# Patient Record
Sex: Female | Born: 1980 | Race: White | Hispanic: No | Marital: Married | Smoking: Never smoker
Health system: Southern US, Community
[De-identification: ages and names within clinical notes are randomized; demographics above are authoritative.]

## PROBLEM LIST (undated history)

## (undated) DIAGNOSIS — K5900 Constipation, unspecified: Secondary | ICD-10-CM

## (undated) DIAGNOSIS — F32A Depression, unspecified: Secondary | ICD-10-CM

## (undated) DIAGNOSIS — F329 Major depressive disorder, single episode, unspecified: Secondary | ICD-10-CM

## (undated) DIAGNOSIS — T7840XA Allergy, unspecified, initial encounter: Secondary | ICD-10-CM

## (undated) HISTORY — DX: Major depressive disorder, single episode, unspecified: F32.9

## (undated) HISTORY — PX: HYSTEROSCOPY: SHX211

## (undated) HISTORY — DX: Allergy, unspecified, initial encounter: T78.40XA

## (undated) HISTORY — DX: Depression, unspecified: F32.A

## (undated) HISTORY — PX: KNEE ARTHROSCOPY W/ MENISCAL REPAIR: SHX1877

## (undated) HISTORY — DX: Constipation, unspecified: K59.00

---
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
## 2009-02-14 HISTORY — PX: ABDOMINAL HYSTERECTOMY: SHX81

## 2009-03-04 ENCOUNTER — Ambulatory Visit: Payer: Self-pay | Admitting: Family Medicine

## 2009-04-15 ENCOUNTER — Emergency Department: Payer: Self-pay | Admitting: Emergency Medicine

## 2011-01-19 ENCOUNTER — Other Ambulatory Visit: Payer: Self-pay | Admitting: Otolaryngology

## 2011-01-19 DIAGNOSIS — H905 Unspecified sensorineural hearing loss: Secondary | ICD-10-CM

## 2011-01-19 DIAGNOSIS — H9319 Tinnitus, unspecified ear: Secondary | ICD-10-CM

## 2011-01-21 ENCOUNTER — Ambulatory Visit
Admission: RE | Admit: 2011-01-21 | Discharge: 2011-01-21 | Disposition: A | Discharge status: Home / Self Care | Payer: BC Managed Care – PPO | Source: Ambulatory Visit | Attending: Otolaryngology | Admitting: Otolaryngology

## 2011-01-21 DIAGNOSIS — H9319 Tinnitus, unspecified ear: Secondary | ICD-10-CM

## 2011-01-21 DIAGNOSIS — H905 Unspecified sensorineural hearing loss: Secondary | ICD-10-CM

## 2011-01-21 MED ORDER — GADOBENATE DIMEGLUMINE 529 MG/ML IV SOLN
15.0000 mL | Freq: Once | INTRAVENOUS | Status: AC | PRN
  Administered 2011-01-21: 15 mL via INTRAVENOUS

## 2011-05-17 ENCOUNTER — Ambulatory Visit: Payer: BC Managed Care – PPO | Admitting: Family Medicine

## 2012-03-30 ENCOUNTER — Ambulatory Visit: Payer: Self-pay | Admitting: Nurse Practitioner

## 2013-02-06 ENCOUNTER — Encounter: Payer: Self-pay | Admitting: Adult Health

## 2013-02-06 ENCOUNTER — Encounter (INDEPENDENT_AMBULATORY_CARE_PROVIDER_SITE_OTHER): Payer: Self-pay

## 2013-02-06 ENCOUNTER — Ambulatory Visit (INDEPENDENT_AMBULATORY_CARE_PROVIDER_SITE_OTHER): Payer: BC Managed Care – PPO | Admitting: Adult Health

## 2013-02-06 VITALS — BP 124/74 | HR 79 | Temp 97.8°F | Resp 16 | Wt 163.0 lb

## 2013-02-06 DIAGNOSIS — K5900 Constipation, unspecified: Secondary | ICD-10-CM

## 2013-02-06 DIAGNOSIS — Z0000 Encounter for general adult medical examination without abnormal findings: Secondary | ICD-10-CM | POA: Insufficient documentation

## 2013-02-06 DIAGNOSIS — F3289 Other specified depressive episodes: Secondary | ICD-10-CM

## 2013-02-06 DIAGNOSIS — F329 Major depressive disorder, single episode, unspecified: Secondary | ICD-10-CM

## 2013-02-06 DIAGNOSIS — Z0001 Encounter for general adult medical examination with abnormal findings: Secondary | ICD-10-CM | POA: Insufficient documentation

## 2013-02-06 DIAGNOSIS — F32A Depression, unspecified: Secondary | ICD-10-CM | POA: Insufficient documentation

## 2013-02-06 NOTE — Assessment & Plan Note (Signed)
Well controlled with diet and exercise.  

## 2013-02-06 NOTE — Patient Instructions (Signed)
   Thank you for choosing [facility] for your health care needs.  Please have your labs drawn at your earliest convenience. You will need to fast. No food or drink after midnight the night before. You may drink water prior to labs.  The results will be available through MyChart for your convenience. Please remember to activate this. The activation code is located at the end of this form.

## 2013-02-06 NOTE — Progress Notes (Signed)
Subjective:    Patient ID: Meghan Jones, female    DOB: [birth year], 32 y.o.   MRN: [id number]  HPI  Meghan Jones is a very pleasant 32 y/o female who presents to clinic to establish care. Previously followed by Dr. Alison Murray. Will request medical records. Overall she is feeling well.    Past Medical History  Diagnosis Date  . Depression     Mild  . Allergy   . Constipation     Controlled with Shakeology through [facility]     Past Surgical History  Procedure Laterality Date  . Appendectomy  2000  . Abdominal hysterectomy  2011    Asherman's Syndrome     Family History  Problem Relation Age of Onset  . Hyperlipidemia Maternal Grandfather   . Hyperlipidemia Paternal Grandmother   . Heart disease Paternal Grandmother 53    CAD - Died MI  . Mental illness Paternal Grandmother   . Hypertension Paternal Grandmother   . Depression Paternal Grandmother     Suicide attempt x 3  . Obesity Paternal Grandmother   . Diabetes Paternal Grandmother   . Hyperlipidemia Paternal Grandfather   . Obesity Paternal Grandfather   . Hypertension Paternal Grandfather   . Diabetes Paternal Grandfather   . Depression Sister      History   Social History  . Marital Status: Married    Spouse Name: [name]    Number of Children: 1  . Years of Education: 38   Occupational History  . Speech Therapist     [employer]   Social History Main Topics  . Smoking status: Never Smoker   . Smokeless tobacco: Not on file  . Alcohol Use: 1.2 oz/week    1 Glasses of wine, 1 Shots of liquor per week     Comment: Occassionally 2 drinks weekly  . Drug Use: No  . Sexual Activity: Yes    Birth Control/ Protection: Surgical   Other Topics Concern  . Not on file   Social History Narrative   [name] grew up in [place], [place]. She attended [address] [employer] [place] in [place] ([employer]) and obtained her bachelors in Psychologist, sport and exercise. She then attended [employer] and obtained her Masters  in [employer]. 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 She lives at home with her husband [name]) and daughter Meghan Jones). They have one cat, Buzzy. 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      Review of Systems  Constitutional: Negative.   HENT: Negative.   Eyes: Negative.   Respiratory: Negative.   Cardiovascular: Negative.   Gastrointestinal: Negative.   Endocrine: Negative.   Genitourinary: Negative.   Musculoskeletal: Negative.   Skin: Negative.   Allergic/Immunologic: Negative.   Neurological: Negative.   Hematological: Negative.   Psychiatric/Behavioral: Negative.        Objective:   Physical Exam  Constitutional: She is oriented to person, place, and time. She appears well-developed and well-nourished. No distress.  HENT:  Head: Normocephalic and atraumatic.  Right Ear: External ear normal.  Left Ear: External ear normal.  Nose: Nose normal.  Mouth/Throat: Oropharynx is clear and moist.  Eyes: Conjunctivae and EOM are normal. Pupils are equal, round, and reactive to light.  Neck: Normal range of motion. Neck supple. No tracheal deviation present. No thyromegaly present.  Cardiovascular: Normal rate, regular rhythm, normal heart sounds and intact distal pulses.  Exam reveals no gallop and no friction rub.   No murmur heard. Pulmonary/Chest: Effort normal and breath  sounds normal. No respiratory distress. She has no wheezes. She has no rales.  Abdominal: Soft. Bowel sounds are normal. She exhibits no distension and no mass. There is no tenderness. There is no rebound and no guarding.  Musculoskeletal: Normal range of motion. She exhibits no edema and no tenderness.  Lymphadenopathy:    She has no cervical adenopathy.  Neurological: She is alert and oriented to person, place, and time. She has normal reflexes. No cranial nerve deficit. Coordination normal.  Skin: Skin is warm and dry.  Psychiatric: She has a normal mood and affect. Her behavior is  normal. Judgment and thought content normal.          Assessment & Plan:

## 2013-02-06 NOTE — Assessment & Plan Note (Signed)
Normal physical exam. Check routine labs: CBC with differential, TSH, lipids, comprehensive metabolic panel, vitamin D and A54

## 2013-02-06 NOTE — Assessment & Plan Note (Signed)
Patient takes product called Shakeology by [facility] which keeps her regular.

## 2013-02-06 NOTE — Progress Notes (Signed)
Pre visit review using our clinic review tool, if applicable. No additional management support is needed unless otherwise documented below in the visit note. 

## 2013-02-15 ENCOUNTER — Other Ambulatory Visit (INDEPENDENT_AMBULATORY_CARE_PROVIDER_SITE_OTHER): Payer: BC Managed Care – PPO

## 2013-02-15 ENCOUNTER — Encounter: Payer: Self-pay | Admitting: Adult Health

## 2013-02-15 ENCOUNTER — Other Ambulatory Visit: Payer: Self-pay | Admitting: Adult Health

## 2013-02-15 DIAGNOSIS — R899 Unspecified abnormal finding in specimens from other organs, systems and tissues: Secondary | ICD-10-CM

## 2013-02-15 DIAGNOSIS — Z0000 Encounter for general adult medical examination without abnormal findings: Secondary | ICD-10-CM

## 2013-02-15 LAB — COMPREHENSIVE METABOLIC PANEL
ALT: 12 U/L (ref 0–35)
AST: 18 U/L (ref 0–37)
Albumin: 4.5 g/dL (ref 3.5–5.2)
Alkaline Phosphatase: 47 U/L (ref 39–117)
BUN: 19 mg/dL (ref 6–23)
CO2: 26 mEq/L (ref 19–32)
Calcium: 9.2 mg/dL (ref 8.4–10.5)
Chloride: 107 mEq/L (ref 96–112)
Creatinine, Ser: 1 mg/dL (ref 0.4–1.2)
GFR: 69.24 mL/min (ref >60.00)
Glucose, Bld: 107 mg/dL — ABNORMAL HIGH (ref 70–99)
Potassium: 4.6 mEq/L (ref 3.5–5.1)
Sodium: 139 mEq/L (ref 135–145)
Total Bilirubin: 0.6 mg/dL (ref 0.3–1.2)
Total Protein: 7.3 g/dL (ref 6.0–8.3)

## 2013-02-15 LAB — CBC WITH DIFFERENTIAL/PLATELET
Basophils Absolute: 0 10*3/uL (ref 0.0–0.1)
Basophils Relative: 0.5 % (ref 0.0–3.0)
Eosinophils Absolute: 0.4 10*3/uL (ref 0.0–0.7)
Eosinophils Relative: 5.1 % — ABNORMAL HIGH (ref 0.0–5.0)
HCT: 41.3 % (ref 36.0–46.0)
Hemoglobin: 14.1 g/dL (ref 12.0–15.0)
Lymphocytes Relative: 34.7 % (ref 12.0–46.0)
Lymphs Abs: 3 10*3/uL (ref 0.7–4.0)
MCHC: 34.2 g/dL (ref 30.0–36.0)
MCV: 87.6 fl (ref 78.0–100.0)
Monocytes Absolute: 0.7 10*3/uL (ref 0.1–1.0)
Monocytes Relative: 8.1 % (ref 3.0–12.0)
Neutro Abs: 4.4 10*3/uL (ref 1.4–7.7)
Neutrophils Relative %: 51.6 % (ref 43.0–77.0)
Platelets: 272 10*3/uL (ref 150.0–400.0)
RBC: 4.72 Mil/uL (ref 3.87–5.11)
RDW: 12.9 % (ref 11.5–14.6)
WBC: 8.6 10*3/uL (ref 4.5–10.5)

## 2013-02-15 LAB — LIPID PANEL
Cholesterol: 173 mg/dL (ref 0–200)
HDL: 67.3 mg/dL (ref >39.00)
LDL Cholesterol: 95 mg/dL (ref 0–99)
Total CHOL/HDL Ratio: 3
Triglycerides: 52 mg/dL (ref 0.0–149.0)
VLDL: 10.4 mg/dL (ref 0.0–40.0)

## 2013-02-15 LAB — VITAMIN B12: Vitamin B-12: 306 pg/mL (ref 211–911)

## 2013-02-15 LAB — TSH: TSH: 0.31 u[IU]/mL — ABNORMAL LOW (ref 0.35–5.50)

## 2013-02-16 LAB — VITAMIN D 25 HYDROXY (VIT D DEFICIENCY, FRACTURES): Vit D, 25-Hydroxy: 27 ng/mL — ABNORMAL LOW (ref 30–89)

## 2013-02-17 ENCOUNTER — Encounter: Payer: Self-pay | Admitting: Adult Health

## 2013-02-18 NOTE — Telephone Encounter (Signed)
Mailed unread message to pt  

## 2013-02-21 ENCOUNTER — Other Ambulatory Visit (INDEPENDENT_AMBULATORY_CARE_PROVIDER_SITE_OTHER): Payer: BC Managed Care – PPO

## 2013-02-21 DIAGNOSIS — R6889 Other general symptoms and signs: Secondary | ICD-10-CM

## 2013-02-21 DIAGNOSIS — R899 Unspecified abnormal finding in specimens from other organs, systems and tissues: Secondary | ICD-10-CM

## 2013-02-21 NOTE — Addendum Note (Signed)
Addended by: Montine CircleMALDONADO, Lamontae Ricardo D on: 02/21/2013 04:08 PM   Modules accepted: Orders

## 2013-02-22 ENCOUNTER — Encounter: Payer: Self-pay | Admitting: Adult Health

## 2013-02-22 LAB — HEMOGLOBIN A1C: Hgb A1c MFr Bld: 5.1 % (ref 4.6–6.5)

## 2013-02-22 LAB — T4: T4, Total: 6 ug/dL (ref 5.0–12.5)

## 2013-11-12 ENCOUNTER — Ambulatory Visit (INDEPENDENT_AMBULATORY_CARE_PROVIDER_SITE_OTHER): Payer: BC Managed Care – PPO | Admitting: Internal Medicine

## 2013-11-12 ENCOUNTER — Encounter: Payer: Self-pay | Admitting: Internal Medicine

## 2013-11-12 VITALS — BP 110/76 | HR 85 | Temp 98.7°F | Wt 156.2 lb

## 2013-11-12 DIAGNOSIS — J069 Acute upper respiratory infection, unspecified: Secondary | ICD-10-CM

## 2013-11-12 NOTE — Progress Notes (Signed)
Pre visit review using our clinic review tool, if applicable. No additional management support is needed unless otherwise documented below in the visit note. 

## 2013-11-12 NOTE — Patient Instructions (Signed)
Continue supportive care with Tylenol or Ibuprofen as needed for muscle aches and fever. Increase fluid intake. Rest.  Email with update tomorrow.   Viral Infections A viral infection can be caused by different types of viruses.Most viral infections are not serious and resolve on their own. However, some infections may cause severe symptoms and may lead to further complications. SYMPTOMS Viruses can frequently cause:  Minor sore throat.  Aches and pains.  Headaches.  Runny nose.  Different types of rashes.  Watery eyes.  Tiredness.  Cough.  Loss of appetite.  Gastrointestinal infections, resulting in nausea, vomiting, and diarrhea. These symptoms do not respond to antibiotics because the infection is not caused by bacteria. However, you might catch a bacterial infection following the viral infection. This is sometimes called a "superinfection." Symptoms of such a bacterial infection may include:  Worsening sore throat with pus and difficulty swallowing.  Swollen neck glands.  Chills and a high or persistent fever.  Severe headache.  Tenderness over the sinuses.  Persistent overall ill feeling (malaise), muscle aches, and tiredness (fatigue).  Persistent cough.  Yellow, green, or brown mucus production with coughing. HOME CARE INSTRUCTIONS   Only take over-the-counter or prescription medicines for pain, discomfort, diarrhea, or fever as directed by your caregiver.  Drink enough water and fluids to keep your urine clear or pale yellow. Sports drinks can provide valuable electrolytes, sugars, and hydration.  Get plenty of rest and maintain proper nutrition. Soups and broths with crackers or rice are fine. SEEK IMMEDIATE MEDICAL CARE IF:   You have severe headaches, shortness of breath, chest pain, neck pain, or an unusual rash.  You have uncontrolled vomiting, diarrhea, or you are unable to keep down fluids.  You or your child has an oral temperature above 102  F (38.9 C), not controlled by medicine.  Your baby is older than 3 months with a rectal temperature of 102 F (38.9 C) or higher.  Your baby is 643 months old or younger with a rectal temperature of 100.4 F (38 C) or higher. MAKE SURE YOU:   Understand these instructions.  Will watch your condition.  Will get help right away if you are not doing well or get worse. Document Released: 11/10/2004 Document Revised: 04/25/2011 Document Reviewed: 06/07/2010 [facility] Patient Information 2015 [place]Care, [place]. This information is not intended to replace advice given to you by your health care provider. Make sure you discuss any questions you have with your health care provider.

## 2013-11-12 NOTE — Progress Notes (Signed)
Subjective:    Patient ID: Meghan Jones, female    DOB: 09-19-1980, 33 y.o.   MRN: [id number]  HPI 33YO female presents for acute visit.  11am yesterday developed muscle aches, malaise, chills, nasal congestion. Fever 99.7 at home. No NVD. No energy at all. Mild sore throat a few days ago. No rash. No current headache or neck stiffness. Feels "horrible." Works in a elementary school. Has not yet had her flu vaccine.   Review of Systems  Constitutional: Positive for fever, chills, activity change, appetite change and fatigue. Negative for unexpected weight change.  HENT: Positive for congestion and sore throat. Negative for ear discharge, ear pain, facial swelling, hearing loss, mouth sores, nosebleeds, postnasal drip, rhinorrhea, sinus pressure, sneezing, tinnitus, trouble swallowing and voice change.   Eyes: Negative for pain, discharge, redness and visual disturbance.  Respiratory: Negative for cough, chest tightness, shortness of breath, wheezing and stridor.   Cardiovascular: Negative for chest pain, palpitations and leg swelling.  Gastrointestinal: Negative for nausea, vomiting, abdominal pain, diarrhea, constipation and blood in stool.  Musculoskeletal: Positive for myalgias. Negative for arthralgias, neck pain and neck stiffness.  Skin: Negative for color change and rash.  Neurological: Negative for dizziness, weakness, light-headedness and headaches.  Hematological: Negative for adenopathy.       Objective:    BP 110/76  Pulse 85  Temp(Src) 98.7 F (37.1 C) (Oral)  Wt 156 lb 4 oz (70.875 kg)  SpO2 96% Physical Exam  Constitutional: She is oriented to person, place, and time. She appears well-developed and well-nourished. She has a sickly appearance. No distress.  HENT:  Head: Normocephalic and atraumatic.  Right Ear: External ear normal.  Left Ear: External ear normal.  Nose: Nose normal.  Mouth/Throat: Oropharynx is clear and moist. No oropharyngeal exudate.    Eyes: Conjunctivae are normal. Pupils are equal, round, and reactive to light. Right eye exhibits no discharge. Left eye exhibits no discharge. No scleral icterus.  Neck: Normal range of motion. Neck supple. No tracheal deviation present. No thyromegaly present.  Cardiovascular: Normal rate, regular rhythm, normal heart sounds and intact distal pulses.  Exam reveals no gallop and no friction rub.   No murmur heard. Pulmonary/Chest: Effort normal and breath sounds normal. No accessory muscle usage. Not tachypneic. No respiratory distress. She has no decreased breath sounds. She has no wheezes. She has no rhonchi. She has no rales. She exhibits no tenderness.  Musculoskeletal: Normal range of motion. She exhibits no edema and no tenderness.  Lymphadenopathy:    She has no cervical adenopathy.  Neurological: She is alert and oriented to person, place, and time. No cranial nerve deficit. She exhibits normal muscle tone. Coordination normal.  Skin: Skin is warm and dry. No rash noted. She is not diaphoretic. No erythema. No pallor.  Psychiatric: She has a normal mood and affect. Her behavior is normal. Judgment and thought content normal.          Assessment & Plan:   Problem List Items Addressed This Visit     Unprioritized   Viral upper respiratory infection - Primary     Symptoms most consistent with viral URI. Rapid flu test negative today, however symptoms c/w flu. May be too early in course. We discussed starting empiric Tamiflu. She prefers to hold off for now. She will email with update tomorrow. Increase fluid intake, rest. Tylenol or Ibuprofen prn pain or fever. Call immediately with worsening symptoms.        Return in about 1  week (around 11/19/2013), or if symptoms worsen or fail to improve.

## 2013-11-12 NOTE — Assessment & Plan Note (Addendum)
Symptoms most consistent with viral URI. Rapid flu test negative today, however symptoms c/w flu. May be too early in course. We discussed starting empiric Tamiflu. She prefers to hold off for now. She will email with update tomorrow. Increase fluid intake, rest. Tylenol or Ibuprofen prn pain or fever. Call immediately with worsening symptoms.

## 2013-11-13 ENCOUNTER — Encounter: Payer: Self-pay | Admitting: Internal Medicine

## 2013-11-13 ENCOUNTER — Telehealth: Payer: Self-pay | Admitting: Internal Medicine

## 2013-11-13 ENCOUNTER — Other Ambulatory Visit: Payer: Self-pay | Admitting: Internal Medicine

## 2013-11-13 DIAGNOSIS — R509 Fever, unspecified: Secondary | ICD-10-CM

## 2013-11-13 MED ORDER — DOXYCYCLINE HYCLATE 100 MG PO TABS: 100.0000 mg | ORAL_TABLET | Freq: Two times a day (BID) | ORAL | Status: DC

## 2013-11-13 NOTE — Telephone Encounter (Signed)
Can you call and check on her today? She had emailed saying headache was present today. I called in Doxycycline for her to start.

## 2013-11-13 NOTE — Telephone Encounter (Signed)
OK. If she does not have a fever, that is okay. However, some patients do not recall being bit by a tick and have RMSF. If any recurrent fever and headache, I would recommend Doxycycline.

## 2013-11-13 NOTE — Telephone Encounter (Signed)
Pt states that she does not have a fever this morning, advised her that Doxycycline was sent to pharmacy for her, pt states that you may have been concerned about [facility] Fever and she states "I have not been bitten by a tick therefore I'm not going to pick up that prescription"

## 2013-11-15 NOTE — Telephone Encounter (Signed)
PT was notified by mychart, see other encounter.

## 2013-11-20 ENCOUNTER — Encounter: Payer: Self-pay | Admitting: Internal Medicine

## 2013-11-22 ENCOUNTER — Encounter: Payer: Self-pay | Admitting: Internal Medicine

## 2013-11-22 ENCOUNTER — Ambulatory Visit (INDEPENDENT_AMBULATORY_CARE_PROVIDER_SITE_OTHER): Payer: BC Managed Care – PPO | Admitting: Internal Medicine

## 2013-11-22 VITALS — BP 120/86 | HR 75 | Temp 98.2°F | Wt 160.5 lb

## 2013-11-22 DIAGNOSIS — R590 Localized enlarged lymph nodes: Secondary | ICD-10-CM | POA: Insufficient documentation

## 2013-11-22 DIAGNOSIS — H9201 Otalgia, right ear: Secondary | ICD-10-CM | POA: Insufficient documentation

## 2013-11-22 DIAGNOSIS — M542 Cervicalgia: Secondary | ICD-10-CM

## 2013-11-22 DIAGNOSIS — R599 Enlarged lymph nodes, unspecified: Secondary | ICD-10-CM

## 2013-11-22 NOTE — Assessment & Plan Note (Signed)
Exam is most consistent with right OM. Encouraged her to continue with Doxycycline. Will set up evaluation with ENT for better assessment of middle ear pressure and evaluation of tinnitis.

## 2013-11-22 NOTE — Assessment & Plan Note (Signed)
Most likely secondary to spasm of trapezius muscle. Exam is normal except for mild muscular tenderness and macular erythematous area. Will continue Naprosyn. Pt will call immediately if worsening pain or fever.

## 2013-11-22 NOTE — Progress Notes (Signed)
Subjective:    Patient ID: Meghan Jones, female    DOB: 1980/09/13, 33 y.o.   MRN: [id number]  HPI 33YO female presents for follow up. Recently seen for fever, chills. Diagnosed as viral URI. Fever and malaise have improved. Having posterior neck pain. Has full ROM of neck. No focal pain. No recurrent fever. Continues to feel "Achy." but much better. Taking occasional Aleve with improvement in symptoms. Notes erythematous rash over posterior neck.  Not painful or itchy. No blistering lesions. Notes some large lymph nodes in anterior neck. Not tender.  No sore throat. No trouble swallowing. Notes some right ear pain and increased tinnitis (worse than baseline). Started on Doxycycline 3 days ago but has missed a few doses.  Review of Systems  Constitutional: Negative for fever, chills, appetite change, fatigue and unexpected weight change.  HENT: Positive for tinnitus (chronic right ear). Negative for congestion, facial swelling, postnasal drip, rhinorrhea, sinus pressure, sore throat, trouble swallowing and voice change.   Eyes: Negative for visual disturbance.  Respiratory: Negative for cough, shortness of breath and wheezing.   Cardiovascular: Negative for chest pain and leg swelling.  Gastrointestinal: Negative for nausea, vomiting, abdominal pain, diarrhea, constipation and blood in stool.  Musculoskeletal: Positive for arthralgias, myalgias and neck pain. Negative for neck stiffness.  Skin: Positive for rash. Negative for color change.  Hematological: Positive for adenopathy. Does not bruise/bleed easily.  Psychiatric/Behavioral: Negative for dysphoric mood. The patient is not nervous/anxious.        Objective:    BP 120/86  Pulse 75  Temp(Src) 98.2 F (36.8 C) (Oral)  Wt 160 lb 8 oz (72.802 kg)  SpO2 97% Physical Exam  Constitutional: She is oriented to person, place, and time. She appears well-developed and well-nourished. No distress.  HENT:  Head: Normocephalic and  atraumatic.  Right Ear: External ear normal. Tympanic membrane is bulging. A middle ear effusion is present.  Left Ear: External ear normal. Tympanic membrane is not bulging.  No middle ear effusion.  Nose: Nose normal.  Mouth/Throat: Oropharynx is clear and moist. No oropharyngeal exudate.  Eyes: Conjunctivae are normal. Pupils are equal, round, and reactive to light. Right eye exhibits no discharge. Left eye exhibits no discharge. No scleral icterus.  Neck: Normal range of motion. Neck supple. Muscular tenderness present. No spinous process tenderness present. No rigidity. No tracheal deviation, no edema and normal range of motion present. No mass and no thyromegaly present.  Cardiovascular: Normal rate, regular rhythm, normal heart sounds and intact distal pulses.  Exam reveals no gallop and no friction rub.   No murmur heard. Pulmonary/Chest: Effort normal and breath sounds normal. No accessory muscle usage. Not tachypneic. No respiratory distress. She has no decreased breath sounds. She has no wheezes. She has no rhonchi. She has no rales. She exhibits no tenderness.  Musculoskeletal: Normal range of motion. She exhibits no edema and no tenderness.  Lymphadenopathy:    She has cervical adenopathy.  Shoddy anterior cervical nodes bilaterally  Neurological: She is alert and oriented to person, place, and time. No cranial nerve deficit. She exhibits normal muscle tone. Coordination normal.  Skin: Skin is warm and dry. No rash noted. She is not diaphoretic. There is erythema (erythematous macular area base of skull, no induration). No pallor.  Psychiatric: She has a normal mood and affect. Her behavior is normal. Judgment and thought content normal.          Assessment & Plan:   Problem List Items Addressed This Visit  Unprioritized   Lymphadenopathy, anterior cervical     Shoddy anterior cervical LAD. Likely secondary to viral URI. Will monitor. We discussed potentially getting CT  neck if persistent or enlarging nodes or worsening pain. Follow up with ENT evaluation on Monday.    Relevant Orders      Ambulatory referral to ENT   Neck pain     Most likely secondary to spasm of trapezius muscle. Exam is normal except for mild muscular tenderness and macular erythematous area. Will continue Naprosyn. Pt will call immediately if worsening pain or fever.    Right ear pain - Primary     Exam is most consistent with right OM. Encouraged her to continue with Doxycycline. Will set up evaluation with ENT for better assessment of middle ear pressure and evaluation of tinnitis.    Relevant Orders      Ambulatory referral to ENT       Return in about 2 weeks (around 12/06/2013), or if symptoms worsen or fail to improve.

## 2013-11-22 NOTE — Telephone Encounter (Signed)
Can you talk to her and see if she wants to be seen in clinic today? We can work her in at 12noon or 1pm?

## 2013-11-22 NOTE — Patient Instructions (Addendum)
We will set up an evaluation with ENT Monday. Call immediately if any fever, chills, or worsening pain.  Continue Doxycycline. Follow up 1 week.

## 2013-11-22 NOTE — Assessment & Plan Note (Addendum)
Shoddy anterior cervical LAD. Likely secondary to viral URI. Will monitor. We discussed potentially getting CT neck if persistent or enlarging nodes or worsening pain. Follow up with ENT evaluation on Monday.

## 2013-11-22 NOTE — Progress Notes (Signed)
Pre visit review using our clinic review tool, if applicable. No additional management support is needed unless otherwise documented below in the visit note. 

## 2013-11-25 ENCOUNTER — Ambulatory Visit: Payer: BC Managed Care – PPO | Admitting: Internal Medicine

## 2013-12-14 ENCOUNTER — Ambulatory Visit: Payer: Self-pay | Admitting: Otolaryngology

## 2014-03-28 ENCOUNTER — Other Ambulatory Visit: Payer: Self-pay | Admitting: Nurse Practitioner

## 2014-03-28 ENCOUNTER — Ambulatory Visit (INDEPENDENT_AMBULATORY_CARE_PROVIDER_SITE_OTHER): Payer: BC Managed Care – PPO | Admitting: Nurse Practitioner

## 2014-03-28 ENCOUNTER — Encounter: Payer: Self-pay | Admitting: Nurse Practitioner

## 2014-03-28 VITALS — BP 110/80 | HR 95 | Temp 98.1°F | Resp 12 | Ht 66.0 in | Wt 166.8 lb

## 2014-03-28 DIAGNOSIS — F32A Depression, unspecified: Secondary | ICD-10-CM

## 2014-03-28 DIAGNOSIS — F329 Major depressive disorder, single episode, unspecified: Secondary | ICD-10-CM

## 2014-03-28 MED ORDER — ESCITALOPRAM OXALATE 10 MG PO TABS: 10.0000 mg | ORAL_TABLET | Freq: Every day | ORAL | Status: DC

## 2014-03-28 NOTE — Progress Notes (Signed)
Pre visit review using our clinic review tool, if applicable. No additional management support is needed unless otherwise documented below in the visit note. 

## 2014-03-28 NOTE — Patient Instructions (Signed)
6 weeks follow up.   Depression Depression refers to feeling sad, low, down in the dumps, blue, gloomy, or empty. In general, there are two kinds of depression: 1. Normal sadness or normal grief. This kind of depression is one that we all feel from time to time after upsetting life experiences, such as the loss of a job or the ending of a relationship. This kind of depression is considered normal, is short lived, and resolves within a few days to 2 weeks. Depression experienced after the loss of a loved one (bereavement) often lasts longer than 2 weeks but normally gets better with time. 2. Clinical depression. This kind of depression lasts longer than normal sadness or normal grief or interferes with your ability to function at home, at work, and in school. It also interferes with your personal relationships. It affects almost every aspect of your life. Clinical depression is an illness. Symptoms of depression can also be caused by conditions other than those mentioned above, such as:  Physical illness. Some physical illnesses, including underactive thyroid gland (hypothyroidism), severe anemia, specific types of cancer, diabetes, uncontrolled seizures, heart and lung problems, strokes, and chronic pain are commonly associated with symptoms of depression.  Side effects of some prescription medicine. In some people, certain types of medicine can cause symptoms of depression.  Substance abuse. Abuse of alcohol and illicit drugs can cause symptoms of depression. SYMPTOMS Symptoms of normal sadness and normal grief include the following:  Feeling sad or crying for short periods of time.  Not caring about anything (apathy).  Difficulty sleeping or sleeping too much.  No longer able to enjoy the things you used to enjoy.  Desire to be by oneself all the time (social isolation).  Lack of energy or motivation.  Difficulty concentrating or remembering.  Change in appetite or  weight.  Restlessness or agitation. Symptoms of clinical depression include the same symptoms of normal sadness or normal grief and also the following symptoms:  Feeling sad or crying all the time.  Feelings of guilt or worthlessness.  Feelings of hopelessness or helplessness.  Thoughts of suicide or the desire to harm yourself (suicidal ideation).  Loss of touch with reality (psychotic symptoms). Seeing or hearing things that are not real (hallucinations) or having false beliefs about your life or the people around you (delusions and paranoia). DIAGNOSIS  The diagnosis of clinical depression is usually based on how bad the symptoms are and how long they have lasted. Your health care provider will also ask you questions about your medical history and substance use to find out if physical illness, use of prescription medicine, or substance abuse is causing your depression. Your health care provider may also order blood tests. TREATMENT  Often, normal sadness and normal grief do not require treatment. However, sometimes antidepressant medicine is given for bereavement to ease the depressive symptoms until they resolve. The treatment for clinical depression depends on how bad the symptoms are but often includes antidepressant medicine, counseling with a mental health professional, or both. Your health care provider will help to determine what treatment is best for you. Depression caused by physical illness usually goes away with appropriate medical treatment of the illness. If prescription medicine is causing depression, talk with your health care provider about stopping the medicine, decreasing the dose, or changing to another medicine. Depression caused by the abuse of alcohol or illicit drugs goes away when you stop using these substances. Some adults need professional help in order to stop drinking  or using drugs. SEEK IMMEDIATE MEDICAL CARE IF:  You have thoughts about hurting yourself or  others.  You lose touch with reality (have psychotic symptoms).  You are taking medicine for depression and have a serious side effect. FOR MORE INFORMATION  National Alliance on Mental Illness: www.nami.[employer]org  National Institute of Mental Health: http://www.maynard.net/www.nimh.nih.gov Document Released: 01/29/2000 Document Revised: 06/17/2013 Document Reviewed: 05/02/2011 [facility] Patient Information 2015 [place]Care, [place]. This information is not intended to replace advice given to you by your health care provider. Make sure you discuss any questions you have with your health care provider.

## 2014-03-28 NOTE — Progress Notes (Signed)
Subjective:    Patient ID: Meghan Jones, female    DOB: [birth year], 34 y.o.   MRN: [id number]  HPI  Meghan Jones is a 34 yo female with a CC of depression.  1) Hard to focus, low energy, hared time getting things done. 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 When daughter was born placenta would not deliver and had hysterectomy. She went back through some depression and felt better in about 6 months.   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 This affects them and herself emotionally she reports.   Loves exercising and aerobics losing interest in this "feels off",  Excellent support system- mother, husband  Review of Systems  Constitutional: Negative for fever, chills, diaphoresis and fatigue.  Respiratory: Negative for chest tightness, shortness of breath and wheezing.   Cardiovascular: Negative for chest pain, palpitations and leg swelling.  Gastrointestinal: Negative for nausea, vomiting, diarrhea and rectal pain.  Skin: Negative for rash.  Neurological: Negative for dizziness, weakness, numbness and headaches.  Psychiatric/Behavioral: Negative for suicidal ideas and sleep disturbance. The patient is nervous/anxious.        Depression recently flaring   Past Medical History  Diagnosis Date  . Depression     Mild  . Allergy   . Constipation     Controlled [facility] Sh[facility]    History   Social History  . Marital Status: Married    Spouse Name: Weston Brassick  . Number of Children: 1  . Years of Education: 218   Occupational History  . Speech Therapist     [employer]   Social History Main Topics  . Smoking status: Never Smoker   . Smokeless tobacco: Not on file  . Alcohol Use: 1.2 oz/week    1 Glasses of wine, 1 Shots of liquor per week   Comment: Occassionally 2 drinks weekly  . Drug Use: No  . Sexual Activity: Yes    Birth Control/ Protection: Surgical   Other Topics Concern  . Not on file   Social History Narrative   Meghan Jones grew up in [place], [place]. [place] [employer] [place] [place] ([employer] and obtained her bachelors in Psychologist, sport and exercisepeech Pathology. She then attended [employer] and obtained her Masters in Applied MaterialsSpeech Pathology. 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 She lives at home with her husband [name](Nick) and daughter [name](Kennedy). They have one cat, Buzzy. She enjoys working out [employer] fast streamerand photography.      Past Surgical History  Procedure Laterality Date  . Appendectomy  2000  . Abdominal hysterectomy  2011    Asherman's Syndrome    Family History  Problem Relation Age of Onset  . Hyperlipidemia Maternal Grandfather   . Hyperlipidemia Paternal Grandmother   . Heart disease Paternal Grandmother 2663    CAD - Died MI  . Mental illness Paternal Grandmother   . Hypertension Paternal Grandmother   . Depression Paternal Grandmother     Suicide attempt x 3  . Obesity Paternal Grandmother   . Diabetes Paternal Grandmother   . Hyperlipidemia Paternal Grandfather   . Obesity Paternal Grandfather   . Hypertension Paternal Grandfather   . Diabetes Paternal Grandfather   . Depression Sister     Allergies  Allergen Reactions  . Augmentin [Amoxicillin-Pot  Clavulanate]     Current Outpatient Prescriptions on File Prior to Visit  Medication Sig Dispense Refill  . Multiple Vitamin (MULTIVITAMIN) tablet Take 1 tablet by mouth daily.     No current facility-administered medications on file prior to visit.      Objective:   Physical Exam  Constitutional: She is oriented to person, place, and time. She appears well-developed and well-nourished. No distress.  BP 110/80 mmHg  Pulse 95  Temp(Src) 98.1 F (36.7 C) (Oral)  Resp 12  Ht  (1.676 m)  Wt 166 lb 12.8 oz (75.66 kg)  BMI 26.94 kg/m2  SpO2  98%  LMP    HENT:  Head: Normocephalic and atraumatic.  Right Ear: External ear normal.  Left Ear: External ear normal.  Eyes: Right eye exhibits no discharge. Left eye exhibits no discharge. No scleral icterus.  Neck: Normal range of motion. Neck supple. No thyromegaly present.  Cardiovascular: Normal rate, regular rhythm, normal heart sounds and intact distal pulses.  Exam reveals no gallop and no friction rub.   No murmur heard. Pulmonary/Chest: Effort normal and breath sounds normal. No respiratory distress. She has no wheezes. She has no rales. She exhibits no tenderness.  Musculoskeletal: Normal range of motion. She exhibits no edema.  Neurological: She is alert and oriented to person, place, and time. No cranial nerve deficit. She exhibits normal muscle tone. Coordination normal.  Skin: Skin is warm and dry. No rash noted. She is not diaphoretic.  Psychiatric: She has a normal mood and affect. Her behavior is normal. Judgment and thought content normal.  Very tearful during interview.       Assessment & Plan:

## 2014-03-30 NOTE — Assessment & Plan Note (Signed)
Worsening. Depression and anxiety has recently worsened. Pt is interested in restarting Lexapro. Will start at 10 mg tablet daily. Discussed seeing a counselor and pt feels it is hard to fit in another appointment. I informed her there are many places that do non-traditional hours and she was okay with her support system currently. Gave pt handout on depression. Will follow up in 6 weeks.

## 2014-03-31 ENCOUNTER — Other Ambulatory Visit: Payer: Self-pay

## 2014-03-31 MED ORDER — ESCITALOPRAM OXALATE 10 MG PO TABS: 10.0000 mg | ORAL_TABLET | Freq: Every day | ORAL | Status: DC

## 2014-05-16 ENCOUNTER — Encounter: Payer: Self-pay | Admitting: Nurse Practitioner

## 2014-05-16 ENCOUNTER — Ambulatory Visit (INDEPENDENT_AMBULATORY_CARE_PROVIDER_SITE_OTHER): Payer: BC Managed Care – PPO | Admitting: Nurse Practitioner

## 2014-05-16 VITALS — BP 110/70 | HR 73 | Temp 97.9°F | Resp 12 | Ht 66.0 in | Wt 165.1 lb

## 2014-05-16 DIAGNOSIS — F32A Depression, unspecified: Secondary | ICD-10-CM

## 2014-05-16 DIAGNOSIS — F329 Major depressive disorder, single episode, unspecified: Secondary | ICD-10-CM | POA: Diagnosis not present

## 2014-05-16 NOTE — Progress Notes (Signed)
   Subjective:    Patient ID: Meghan PittMartha H Jones, female    DOB: [birth year], 34 y.o.   MRN: [id number]  HPI  [name] is a 34 yo female here for a 6 week follow up of depression.   1) Pt reports she is stable and that the medication helps her to feel more even. No other complaints and no need for refills at this time. She does ask that the next time she needs 30 day supply with refills instead of 90 day.    Review of Systems  Constitutional: Negative for fever, chills, diaphoresis and fatigue.  Respiratory: Negative for chest tightness, shortness of breath and wheezing.   Cardiovascular: Negative for chest pain, palpitations and leg swelling.  Gastrointestinal: Negative for nausea, vomiting and diarrhea.  Skin: Negative for rash.  Neurological: Negative for dizziness, weakness, numbness and headaches.  Psychiatric/Behavioral: Negative for suicidal ideas and sleep disturbance. The patient is not nervous/anxious.        Objective:   Physical Exam  Constitutional: She is oriented to person, place, and time. She appears well-developed and well-nourished. No distress.  HENT:  Head: Normocephalic and atraumatic.  Right Ear: External ear normal.  Left Ear: External ear normal.  Cardiovascular: Normal rate, regular rhythm and normal heart sounds.  Exam reveals no gallop and no friction rub.   No murmur heard. Pulmonary/Chest: Effort normal and breath sounds normal. No respiratory distress. She has no wheezes. She has no rales. She exhibits no tenderness.  Neurological: She is alert and oriented to person, place, and time. No cranial nerve deficit. She exhibits normal muscle tone. Coordination normal.  Skin: Skin is warm and dry. No rash noted. She is not diaphoretic.  Psychiatric: She has a normal mood and affect. Her behavior is normal. Judgment and thought content normal.   BP 110/70 mmHg  Pulse 73  Temp(Src) 97.9 F (36.6 C) (Oral)  Resp 12  Ht 5\' 6"  (1.676 m)  Wt 165 lb 1.9 oz  (74.898 kg)  BMI 26.66 kg/m2  SpO2 97%        Assessment & Plan:

## 2014-05-16 NOTE — Progress Notes (Signed)
Pre visit review using our clinic review tool, if applicable. No additional management support is needed unless otherwise documented below in the visit note. 

## 2014-05-25 NOTE — Assessment & Plan Note (Signed)
Pt is improved today with help from Lexapro 10 mg daily. She seems to be happier, not tearful, and making good eye contact. Denies suicidal thoughts. Will continue.   *Next refill- pt requests 30 day supply with refills (no more 90 day supply).

## 2015-01-05 ENCOUNTER — Ambulatory Visit (INDEPENDENT_AMBULATORY_CARE_PROVIDER_SITE_OTHER): Payer: BC Managed Care – PPO | Admitting: Family Medicine

## 2015-01-05 ENCOUNTER — Encounter: Payer: Self-pay | Admitting: Family Medicine

## 2015-01-05 ENCOUNTER — Ambulatory Visit (INDEPENDENT_AMBULATORY_CARE_PROVIDER_SITE_OTHER)
Admission: RE | Admit: 2015-01-05 | Discharge: 2015-01-05 | Disposition: A | Discharge status: Home / Self Care | Payer: BC Managed Care – PPO | Source: Ambulatory Visit | Attending: Family Medicine | Admitting: Family Medicine

## 2015-01-05 VITALS — BP 112/62 | HR 80 | Temp 98.0°F | Ht 66.0 in | Wt 173.2 lb

## 2015-01-05 DIAGNOSIS — R059 Cough, unspecified: Secondary | ICD-10-CM

## 2015-01-05 DIAGNOSIS — R05 Cough: Secondary | ICD-10-CM

## 2015-01-05 DIAGNOSIS — J988 Other specified respiratory disorders: Secondary | ICD-10-CM | POA: Diagnosis not present

## 2015-01-05 MED ORDER — LEVOFLOXACIN 500 MG PO TABS: 500.0000 mg | ORAL_TABLET | Freq: Every day | ORAL | Status: DC

## 2015-01-05 MED ORDER — BENZONATATE 100 MG PO CAPS: 100.0000 mg | ORAL_CAPSULE | Freq: Three times a day (TID) | ORAL | Status: DC | PRN

## 2015-01-05 NOTE — Progress Notes (Signed)
Pre visit review using our clinic review tool, if applicable. No additional management support is needed unless otherwise documented below in the visit note. 

## 2015-01-05 NOTE — Patient Instructions (Signed)
It was nice to see you today.  We will call with your chest xray results.  Continue use of probiotics.  Take care  Dr. Adriana Simasook

## 2015-01-05 NOTE — Progress Notes (Signed)
Subjective:  Patient ID: Meghan Jones, female    DOB: July 17, 1980  Age: 34 y.o. MRN: [id number]  CC: Cough, congestion, ear pain  HPI:  34 year old female presents to clinic today with complaints of cough, congestion, ear pain.  Patient reports that she's had the above symptoms for the past 2.5 weeks.  She was seen at the mini clinic and given Augmentin and has had no relief in her symptoms. She's been taking Mucinex as well.  No known exacerbating factors. She has had a positive sick contact as her daughter has been sick as well. No reports of fever, chills, shortness of breath.  Social Hx   Social History   Social History  . Marital Status: Married    Spouse Name: [name]  . Number of Children: 1  . Years of Education: 31   Occupational History  . Speech Therapist     [employer]   Social History Main Topics  . Smoking status: Never Smoker   . Smokeless tobacco: None  . Alcohol Use: 1.2 oz/week    1 Glasses of wine, 1 Shots of liquor per week     Comment: Occassionally 2 drinks weekly  . Drug Use: No  . Sexual Activity: Yes    Birth Control/ Protection: Surgical   Other Topics Concern  . None   Social History Narrative   Karl grew up in [place], [place]. She attended [address] [employer] [place] in [place] ([employer]) and obtained her bachelors in Psychologist, sport and exercise. She then attended [employer] and obtained her Masters in [employer]. 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 She lives at home with her husband [name]) and daughter [name]). They have one cat, Buzzy. 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     Review of Systems  Constitutional: Negative.   HENT: Positive for congestion, ear pain and sore throat.   Respiratory: Positive for cough.    Objective:  BP 112/62 mmHg  Pulse 80  Temp(Src) 98 F (36.7 C) (Oral)  Ht  (1.676 m)  Wt 173 lb 4 oz (78.586 kg)  BMI 27.98 kg/m2  SpO2 97%  BP/Weight 01/05/2015 05/16/2014  03/28/2014  Systolic BP 112 110 110  Diastolic BP 62 70 80  Wt. (Lbs) 173.25 165.12 166.8  BMI 27.98 26.66 26.94   Physical Exam  Constitutional: She appears well-developed. No distress.  HENT:  Head: Normocephalic and atraumatic.  Right Ear: External ear normal.  Left Ear: External ear normal.  Mouth/Throat: Oropharynx is clear and moist. No oropharyngeal exudate.  R TM with effusion. No erythema.  Enlarged turbinates with erythema.   Eyes: Conjunctivae are normal.  Neck: Neck supple.  Cardiovascular: Normal rate and regular rhythm.   No murmur heard. Pulmonary/Chest: Effort normal and breath sounds normal. No respiratory distress. She has no wheezes. She has no rales.  Lymphadenopathy:    She has no cervical adenopathy.  Neurological: She is alert.  Psychiatric: She has a normal mood and affect.  Vitals reviewed.  Assessment & Plan:   Problem List Items Addressed This Visit    Respiratory infection - Primary    Patient with symptoms and signs of sinusitis as well as lower tract infection. Obtaining chest x-ray. Treating empirically with Levaquin. Tessalon for cough.       Other Visit Diagnoses    Cough        Relevant Medications    levofloxacin (LEVAQUIN) 500 MG tablet    benzonatate (TESSALON) 100 MG capsule  Other Relevant Orders    DG Chest 2 View       Meds ordered this encounter  Medications  . loratadine-pseudoephedrine (CLARITIN-D 12-HOUR) 5-120 MG tablet    Sig: Take by mouth.  . levofloxacin (LEVAQUIN) 500 MG tablet    Sig: Take 1 tablet (500 mg total) by mouth daily.    Dispense:  10 tablet    Refill:  0  . benzonatate (TESSALON) 100 MG capsule    Sig: Take 1 capsule (100 mg total) by mouth 3 (three) times daily as needed.    Dispense:  30 capsule    Refill:  0    Follow-up: PRN  Everlene OtherJayce Brance Dartt, DO

## 2015-01-05 NOTE — Assessment & Plan Note (Signed)
Patient with symptoms and signs of sinusitis as well as lower tract infection. Obtaining chest x-ray. Treating empirically with Levaquin. Tessalon for cough.

## 2015-01-13 ENCOUNTER — Telehealth: Payer: Self-pay

## 2015-01-13 NOTE — Telephone Encounter (Signed)
Pt called stating she is still having lots of face congestion with green mucus coming out. Ear pain. 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 She is concerned she may have to where an hearing aid if she continued to have ear problems. PLease advise

## 2015-01-13 NOTE — Telephone Encounter (Signed)
Take a look.

## 2015-01-14 NOTE — Telephone Encounter (Signed)
Can she come back in so I can look at her ears and we can discuss further treatment and referral to audiology for hearing concerns. Thanks!

## 2015-01-14 NOTE — Telephone Encounter (Signed)
Pt will follow up on with her ENT.Does not want to come back in a pay another $15

## 2015-06-12 ENCOUNTER — Encounter: Payer: Self-pay | Admitting: Nurse Practitioner

## 2015-06-12 ENCOUNTER — Ambulatory Visit (INDEPENDENT_AMBULATORY_CARE_PROVIDER_SITE_OTHER): Payer: BC Managed Care – PPO | Admitting: Nurse Practitioner

## 2015-06-12 VITALS — BP 118/70 | HR 80 | Ht 67.0 in | Wt 173.0 lb

## 2015-06-12 DIAGNOSIS — M2550 Pain in unspecified joint: Secondary | ICD-10-CM | POA: Diagnosis not present

## 2015-06-12 LAB — C-REACTIVE PROTEIN: CRP: 0.1 mg/dL — ABNORMAL LOW (ref 0.5–20.0)

## 2015-06-12 LAB — RHEUMATOID FACTOR: Rhuematoid fact SerPl-aCnc: <10 IU/mL (ref <=14)

## 2015-06-12 LAB — SEDIMENTATION RATE: Sed Rate: 7 mm/hr (ref 0–22)

## 2015-06-12 MED ORDER — BUPROPION HCL ER (XL) 150 MG PO TB24: 150.0000 mg | ORAL_TABLET | Freq: Every day | ORAL | Status: DC

## 2015-06-12 NOTE — Progress Notes (Signed)
Patient ID: Meghan Jones, female    DOB: 26-Mar-1980  Age: 35 y.o. MRN: [id number]  CC: Follow-up   HPI QUEENA Jones presents for CC of joint pain x 2 weeks.   1) joints of hand and wrists Hips, shoulders, toes   Onset- 2 weeks Location- multiple, left hand is the worst, intermittent  Duration - constant  Characteristics- aching Aggravating factors- None Relieving factors- None Severity- mild typically 6/10  Denies trauma Advil- helpful   Urine not changed in color/consistency   Did whole 30- went back to gluten and sugar afterwards   History Takela has a past medical history of Depression; Allergy; and Constipation.   She has past surgical history that includes Appendectomy (2000) and Abdominal hysterectomy (2011).   Her family history includes Depression in her paternal grandmother and sister; Diabetes in her paternal grandfather and paternal grandmother; Heart disease (age of onset: 40) in her paternal grandmother; Hyperlipidemia in her maternal grandfather, paternal grandfather, and paternal grandmother; Hypertension in her paternal grandfather and paternal grandmother; Mental illness in her paternal grandmother; Obesity in her paternal grandfather and paternal grandmother.She reports that she has never smoked. She does not have any smokeless tobacco history on file. She reports that she drinks about 1.2 oz of alcohol per week. She reports that she does not use illicit drugs.  Outpatient Prescriptions Prior to Visit  Medication Sig Dispense Refill  . loratadine-pseudoephedrine (CLARITIN-D 12-HOUR) 5-120 MG tablet Take by mouth. Reported on 06/12/2015    . Multiple Vitamin (MULTIVITAMIN) tablet Take 1 tablet by mouth daily. Reported on 06/12/2015    . benzonatate (TESSALON) 100 MG capsule Take 1 capsule (100 mg total) by mouth 3 (three) times daily as needed. (Patient not taking: Reported on 06/12/2015) 30 capsule 0  . escitalopram (LEXAPRO) 10 MG tablet TAKE 1 TABLET(10 MG)  BY MOUTH DAILY (Patient not taking: Reported on 01/05/2015) 90 tablet 0  . levofloxacin (LEVAQUIN) 500 MG tablet Take 1 tablet (500 mg total) by mouth daily. (Patient not taking: Reported on 06/12/2015) 10 tablet 0   No facility-administered medications prior to visit.    ROS Review of Systems  Constitutional: Negative for fever, chills, diaphoresis and fatigue.  Respiratory: Negative for chest tightness, shortness of breath and wheezing.   Cardiovascular: Negative for chest pain, palpitations and leg swelling.  Gastrointestinal: Negative for nausea, vomiting and diarrhea.  Musculoskeletal: Positive for arthralgias.       Multiple joints  Skin: Negative for rash.  Neurological: Negative for dizziness, weakness, numbness and headaches.  Psychiatric/Behavioral: The patient is not nervous/anxious.     Objective:  BP 118/70 mmHg  Pulse 80  Ht '5\' 7"'  (1.702 m)  Wt 173 lb (78.472 kg)  BMI 27.09 kg/m2  SpO2 99%  Physical Exam  Constitutional: She is oriented to person, place, and time. She appears well-developed and well-nourished. No distress.  HENT:  Head: Normocephalic and atraumatic.  Right Ear: External ear normal.  Left Ear: External ear normal.  Cardiovascular: Normal rate, regular rhythm and normal heart sounds.   Pulmonary/Chest: Effort normal and breath sounds normal. No respiratory distress. She has no wheezes. She has no rales. She exhibits no tenderness.  Musculoskeletal: Normal range of motion. She exhibits no edema or tenderness.  Neurological: She is alert and oriented to person, place, and time. No cranial nerve deficit. She exhibits normal muscle tone. Coordination normal.  Skin: Skin is warm and dry. No rash noted. She is not diaphoretic.  Psychiatric: She has a normal mood  and affect. Her behavior is normal. Judgment and thought content normal.      Assessment & Plan:   Meghan Jones was seen today for follow-up.  Diagnoses and all orders for this visit:  Pain,  joint, multiple sites -     Rheumatoid Factor -     Cyclic citrul peptide antibody, IgG -     Antinuclear Antib (ANA) -     Sed Rate (ESR) -     C-reactive protein  Other orders -     buPROPion (WELLBUTRIN XL) 150 MG 24 hr tablet; Take 1 tablet (150 mg total) by mouth daily.   I have discontinued [name]'s escitalopram, levofloxacin, and benzonatate. I am also having her start on buPROPion. Additionally, I am having her maintain her multivitamin and loratadine-pseudoephedrine.  Meds ordered this encounter  Medications  . buPROPion (WELLBUTRIN XL) 150 MG 24 hr tablet    Sig: Take 1 tablet (150 mg total) by mouth daily.    Dispense:  30 tablet    Refill:  1    Order Specific Question:  Supervising Provider    Answer:  Crecencio Mc [[id number]]     Follow-up: Return if symptoms worsen or fail to improve.

## 2015-06-12 NOTE — Patient Instructions (Signed)
We will send your results via MyChart when we get them back and talk about next steps.

## 2015-06-15 LAB — ANA: Anti Nuclear Antibody(ANA): NEGATIVE

## 2015-06-15 LAB — CYCLIC CITRUL PEPTIDE ANTIBODY, IGG: Cyclic Citrullin Peptide Ab: <16 Units

## 2015-06-17 ENCOUNTER — Encounter: Payer: Self-pay | Admitting: Nurse Practitioner

## 2015-06-17 DIAGNOSIS — M2550 Pain in unspecified joint: Secondary | ICD-10-CM | POA: Insufficient documentation

## 2015-06-17 NOTE — Assessment & Plan Note (Signed)
Checking labs today Started on Wellbutrin for care of depression- will see if helpful for both  Follow up after labs

## 2015-06-27 ENCOUNTER — Encounter: Payer: Self-pay | Admitting: Nurse Practitioner

## 2015-07-28 ENCOUNTER — Encounter: Payer: Self-pay | Admitting: Family Medicine

## 2015-08-03 ENCOUNTER — Other Ambulatory Visit: Payer: Self-pay | Admitting: Nurse Practitioner

## 2015-08-04 ENCOUNTER — Encounter: Payer: Self-pay | Admitting: Family Medicine

## 2015-08-04 NOTE — Telephone Encounter (Signed)
Last filled on 06/12/15 #30 + 1, last OV 06/12/15. Ok to refill?

## 2015-08-05 ENCOUNTER — Other Ambulatory Visit: Payer: Self-pay | Admitting: Family Medicine

## 2015-08-05 MED ORDER — BUPROPION HCL ER (XL) 150 MG PO TB24: 150.0000 mg | ORAL_TABLET | Freq: Every day | ORAL | Status: DC

## 2015-08-05 NOTE — Telephone Encounter (Signed)
Approved via Dr.Cook verbally.

## 2016-05-14 ENCOUNTER — Other Ambulatory Visit: Payer: Self-pay | Admitting: Family Medicine

## 2016-06-12 ENCOUNTER — Other Ambulatory Visit: Payer: Self-pay | Admitting: Family Medicine

## 2016-06-21 ENCOUNTER — Encounter: Payer: Self-pay | Admitting: Family Medicine

## 2016-06-21 ENCOUNTER — Ambulatory Visit (INDEPENDENT_AMBULATORY_CARE_PROVIDER_SITE_OTHER): Payer: BC Managed Care – PPO | Admitting: Family Medicine

## 2016-06-21 DIAGNOSIS — M25552 Pain in left hip: Secondary | ICD-10-CM | POA: Diagnosis not present

## 2016-06-21 DIAGNOSIS — F329 Major depressive disorder, single episode, unspecified: Secondary | ICD-10-CM

## 2016-06-21 DIAGNOSIS — F32A Depression, unspecified: Secondary | ICD-10-CM

## 2016-06-21 MED ORDER — BUPROPION HCL ER (XL) 150 MG PO TB24: ORAL_TABLET | ORAL | 0 refills | Status: DC

## 2016-06-21 NOTE — Assessment & Plan Note (Signed)
New problem. Exam unremarkable. Suspect strain. Advised slow return to normal activities, ibuprofen and icing as needed.

## 2016-06-21 NOTE — Progress Notes (Signed)
Subjective:  Patient ID: Meghan Jones, female    DOB: Apr 20, 1980  Age: 36 y.o. MRN: [id number]  CC: Depression, hip pain  HPI:  36 year old female presents with the above complaints.  Depression  Patient states that she is doing quite well on her Wellbutrin.  She has just 1 pill left.  She would like to discuss continuing this medication.  Hip pain  Patient reports ongoing hip pain for the past 1.5 weeks.  She states that it started after she sprained it.  Pain is located at the iliac crest and slightly laterally.  She reports a rubbing sensation.  She states that she has associated decreased range of motion.  She states that she's been icing it, using ibuprofen, and going to yoga with improvement.  She states that she even saw an after-hours orthopedic clinic and was told that this was likely muscular.  She would like to be evaluated today and discuss what she needs to do to resolve this.  Social Hx   Social History   Social History  . Marital status: Married    Spouse name: Weston Brassick  . Number of children: 1  . Years of education: 4918   Occupational History  . 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   Social History Main Topics  . Smoking status: Never Smoker  . Smokeless tobacco: Never Used  . Alcohol use 1.2 oz/week    1 Glasses of wine, 1 Shots of liquor per week     Comment: Occassionally 2 drinks weekly  . Drug use: No  . Sexual activity: Yes    Birth control/ protection: Surgical   Other Topics Concern  . None   Social History Narrative   Johnny [place]a [place], [place]. She [employer] [place] [place] ([employer] and obtained her bachelors in Psychologist, sport and exercisepeech Pathology. She then attended [employer] and obtained her Masters in Applied MaterialsSpeech Pathology. 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 She lives at home with her husband [name](Nick) and daughter [name](Kennedy). They have one cat, Buzzy. She enjoys  working out [employer] fast streamerand photography.      Review of Systems  Constitutional: Negative.   Musculoskeletal:       Hip pain.   Objective:  BP 106/70   Pulse 74   Temp 98.6 F (37 C) (Oral)   Wt 168 lb 2 oz (76.3 kg)   SpO2 98%   BMI 26.33 kg/m   BP/Weight 06/21/2016 06/12/2015 01/05/2015  Systolic BP 106 118 112  Diastolic BP 70 70 62  Wt. (Lbs) 168.13 173 173.25  BMI 26.33 27.09 27.98    Physical Exam  Constitutional: She is oriented to person, place, and time. She appears well-developed. No distress.  Cardiovascular: Normal rate and regular rhythm.   Pulmonary/Chest: Effort normal and breath sounds normal.  Musculoskeletal:  Hip: Left ROM - Normal ROM. Pelvic alignment unremarkable to inspection and palpation. Greater trochanter without tenderness to palpation. No pain with FABER or FADIR.   Neurological: She is alert and oriented to person, place, and time.  Psychiatric: She has a normal mood and affect.  Vitals reviewed.   Lab Results  Component Value Date   WBC 8.6 02/15/2013   HGB 14.1 02/15/2013   HCT 41.3 02/15/2013   PLT 272.0 02/15/2013   GLUCOSE 107 (H) 02/15/2013   CHOL 173 02/15/2013   TRIG 52.0 02/15/2013   HDL 67.30 02/15/2013   LDLCALC 95 02/15/2013   ALT 12  02/15/2013   AST 18 02/15/2013   NA 139 02/15/2013   K 4.6 02/15/2013   CL 107 02/15/2013   CREATININE 1.0 02/15/2013   BUN 19 02/15/2013   CO2 26 02/15/2013   TSH 0.31 (L) 02/15/2013   HGBA1C 5.1 02/21/2013    Assessment & Plan:   Problem List Items Addressed This Visit    Depression    Stable. Refilling Wellbutrin.       Relevant Medications   buPROPion (WELLBUTRIN XL) 150 MG 24 hr tablet   Left hip pain    New problem. Exam unremarkable. Suspect strain. Advised slow return to normal activities, ibuprofen and icing as needed.        Meds ordered this encounter  Medications  . cetirizine (ZYRTEC) 10 MG tablet    Sig: Take 10 mg by mouth daily.  Marland Kitchen buPROPion (WELLBUTRIN XL)  150 MG 24 hr tablet    Sig: TAKE 1 TABLET(150 MG) BY MOUTH DAILY    Dispense:  90 tablet    Refill:  0    Will need to schedule follow up with new provider before getting anymore refills!   Follow-up: PRN  Everlene Other DO [facility]

## 2016-06-21 NOTE — Assessment & Plan Note (Signed)
Stable. Refilling Wellbutrin.

## 2016-06-21 NOTE — Progress Notes (Signed)
Pre visit review using our clinic review tool, if applicable. No additional management support is needed unless otherwise documented below in the visit note. 

## 2016-06-21 NOTE — Patient Instructions (Signed)
I have refilled the wellbutrin.  Slow return back to your normal activities.  Take care  Dr. Adriana Simasook

## 2016-07-13 ENCOUNTER — Telehealth: Payer: Self-pay | Admitting: Family Medicine

## 2016-07-13 NOTE — Telephone Encounter (Signed)
Fine with me, thanks. 

## 2016-07-13 NOTE — Telephone Encounter (Signed)
Yes

## 2016-07-13 NOTE — Telephone Encounter (Signed)
Pt is requesting to transfer to Mayra ReelKate Clark, NP. She is more comfortable with female. Is it OK to transfer?

## 2016-07-18 ENCOUNTER — Ambulatory Visit (INDEPENDENT_AMBULATORY_CARE_PROVIDER_SITE_OTHER): Payer: BC Managed Care – PPO | Admitting: Primary Care

## 2016-07-18 ENCOUNTER — Encounter: Payer: Self-pay | Admitting: Primary Care

## 2016-07-18 VITALS — BP 120/78 | HR 65 | Temp 98.2°F | Ht 65.75 in | Wt 167.1 lb

## 2016-07-18 DIAGNOSIS — F32A Depression, unspecified: Secondary | ICD-10-CM

## 2016-07-18 DIAGNOSIS — F329 Major depressive disorder, single episode, unspecified: Secondary | ICD-10-CM

## 2016-07-18 DIAGNOSIS — R229 Localized swelling, mass and lump, unspecified: Secondary | ICD-10-CM

## 2016-07-18 NOTE — Patient Instructions (Signed)
Apply warm compresses to the site and/or sit in a warm bath to help reduce size.  Please notify me if the spot gets larger, you develop pain, you notice drainage.  It was a pleasure to meet you today! Please don't hesitate to call me with any questions. [facility]

## 2016-07-18 NOTE — Progress Notes (Signed)
Subjective:    Patient ID: Meghan Jones, female    DOB: 10-15-1980, 36 y.o.   MRN: [id number]  HPI  Meghan Jones is a 36 year old female who presents today to transfer care from [facility].   1) Vaginal Spot: Noticed the spot 1 month ago. This is whitish in color, no change in size. Located to the labia minora on the left side. History of hysterectomy that occurred 8 years ago. She denies vaginal discharge, pelvic pain, erythema, swelling. She does shave her vaginal pubic hair.  2) Depression: Currently managed on Wellbutrin XL 150 mg. Previously managed on Lexapro which made her tired. She denies SI/HI. She feels very well managed on Wellbutrin.  Review of Systems  Constitutional: Negative for fever.  Genitourinary: Negative for pelvic pain and vaginal discharge.       Spot to vagina  Psychiatric/Behavioral:       Doing well on Wellbutrin       Past Medical History:  Diagnosis Date  . Allergy   . Constipation    Controlled with Shakeology through [employer]  . Depression    Mild     Social History   Social History  . Marital status: Married    Spouse name: [name]  . Number of children: 1  . Years of education: 68   Occupational History  . [employer]   Social History Main Topics  . Smoking status: Never Smoker  . Smokeless tobacco: Never Used  . Alcohol use 1.2 oz/week    1 Glasses of wine, 1 Shots of liquor per week     Comment: Occassionally 2 drinks weekly  . Drug use: No  . Sexual activity: Yes    Birth control/ protection: Surgical   Other Topics Concern  . Not on file   Social History Narrative   Meghan Jones grew up in [place], [place].    She attended [address] [employer] [place] in [place] ([employer]) and obtained her bachelors in Psychologist, sport and exercise.    She then attended [employer] and obtained her Masters in [employer].    She lives at home with her husband [name]) and daughter  [name]).    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      Past Surgical History:  Procedure Laterality Date  . ABDOMINAL HYSTERECTOMY  2011   Asherman's Syndrome  . APPENDECTOMY  2000    Family History  Problem Relation Age of Onset  . Hyperlipidemia Maternal Grandfather   . Hyperlipidemia Paternal Grandmother   . Heart disease Paternal Grandmother 44       CAD - Died MI  . Mental illness Paternal Grandmother   . Hypertension Paternal Grandmother   . Depression Paternal Grandmother        Suicide attempt x 3  . Obesity Paternal Grandmother   . Diabetes Paternal Grandmother   . Hyperlipidemia Paternal Grandfather   . Obesity Paternal Grandfather   . Hypertension Paternal Grandfather   . Diabetes Paternal Grandfather   . Depression Sister     Allergies  Allergen Reactions  . Levonorgestrel-Ethinyl Estrad Other (See Comments)    Per pt she had C-Diff    Current Outpatient Prescriptions on File Prior to Visit  Medication Sig Dispense Refill  . buPROPion (WELLBUTRIN XL) 150 MG 24 hr tablet TAKE 1 TABLET(150 MG) BY MOUTH DAILY 90 tablet 0  . Multiple Vitamin (MULTIVITAMIN) tablet Take 1 tablet by mouth daily. Reported on 06/12/2015  No current facility-administered medications on file prior to visit.     BP 120/78   Pulse 65   Temp 98.2 F (36.8 C) (Oral)   Ht 5' 5.75" (1.67 m)   Wt 167 lb 1.9 oz (75.8 kg)   SpO2 99%   BMI 27.18 kg/m    Objective:   Physical Exam  Constitutional: She appears well-nourished.  Neck: Neck supple.  Cardiovascular: Normal rate and regular rhythm.   Pulmonary/Chest: Effort normal and breath sounds normal.  Genitourinary:    There is no rash or tenderness on the right labia. There is no rash or tenderness on the left labia. No vaginal discharge found.  Genitourinary Comments: 1-2 mm circular whitish filled spot to left labia minora. No erythema, non tender, no lesions.  Skin: Skin is warm and dry.          Assessment &  Plan:  Vaginal Spot:  Located to left labia minora x 1 month. Exam today with evidence of non infected folliculitis, likely due to shaving. Reassurance provided. Discussed warm compresses and warm baths. Discussed to notify if she develops pain, erythema, increase in size. No evidence of STD type lesions.  Morrie Sheldonlark,Carolle Ishii Kendal, NP

## 2016-08-31 ENCOUNTER — Other Ambulatory Visit: Payer: Self-pay | Admitting: Family Medicine

## 2016-09-02 ENCOUNTER — Other Ambulatory Visit: Payer: Self-pay | Admitting: Family Medicine

## 2016-09-02 ENCOUNTER — Encounter: Payer: Self-pay | Admitting: Primary Care

## 2016-09-02 DIAGNOSIS — F3342 Major depressive disorder, recurrent, in full remission: Secondary | ICD-10-CM

## 2016-09-02 MED ORDER — BUPROPION HCL ER (XL) 150 MG PO TB24: ORAL_TABLET | ORAL | 3 refills | Status: DC

## 2016-09-02 NOTE — Assessment & Plan Note (Signed)
Doing well on Wellbutrin, continue same. 

## 2016-09-02 NOTE — Telephone Encounter (Signed)
New pt to you, Rx never filled by you--- please advise if okay to refill

## 2016-09-05 NOTE — Telephone Encounter (Signed)
Please advise for refills, thanks 

## 2016-09-05 NOTE — Telephone Encounter (Signed)
Refill sent to pharmacy on 09/02/16.

## 2016-12-06 ENCOUNTER — Encounter: Payer: Self-pay | Admitting: Primary Care

## 2016-12-06 ENCOUNTER — Ambulatory Visit (INDEPENDENT_AMBULATORY_CARE_PROVIDER_SITE_OTHER): Payer: BC Managed Care – PPO | Admitting: Primary Care

## 2016-12-06 ENCOUNTER — Ambulatory Visit: Payer: BC Managed Care – PPO

## 2016-12-06 VITALS — BP 120/78 | HR 65 | Temp 98.5°F | Wt 170.4 lb

## 2016-12-06 DIAGNOSIS — F329 Major depressive disorder, single episode, unspecified: Secondary | ICD-10-CM | POA: Diagnosis not present

## 2016-12-06 DIAGNOSIS — M25562 Pain in left knee: Secondary | ICD-10-CM

## 2016-12-06 DIAGNOSIS — Z111 Encounter for screening for respiratory tuberculosis: Secondary | ICD-10-CM | POA: Diagnosis not present

## 2016-12-06 DIAGNOSIS — G8929 Other chronic pain: Secondary | ICD-10-CM | POA: Insufficient documentation

## 2016-12-06 DIAGNOSIS — M25561 Pain in right knee: Secondary | ICD-10-CM | POA: Diagnosis not present

## 2016-12-06 DIAGNOSIS — F32A Depression, unspecified: Secondary | ICD-10-CM

## 2016-12-06 DIAGNOSIS — M25569 Pain in unspecified knee: Secondary | ICD-10-CM

## 2016-12-06 DIAGNOSIS — Z0000 Encounter for general adult medical examination without abnormal findings: Secondary | ICD-10-CM | POA: Diagnosis not present

## 2016-12-06 LAB — COMPREHENSIVE METABOLIC PANEL
ALT: 15 U/L (ref 0–35)
AST: 18 U/L (ref 0–37)
Albumin: 4.8 g/dL (ref 3.5–5.2)
Alkaline Phosphatase: 48 U/L (ref 39–117)
BUN: 21 mg/dL (ref 6–23)
CO2: 27 mEq/L (ref 19–32)
Calcium: 9.8 mg/dL (ref 8.4–10.5)
Chloride: 105 mEq/L (ref 96–112)
Creatinine, Ser: 1.04 mg/dL (ref 0.40–1.20)
GFR: 63.26 mL/min (ref >60.00)
Glucose, Bld: 107 mg/dL — ABNORMAL HIGH (ref 70–99)
Potassium: 5.1 mEq/L (ref 3.5–5.1)
Sodium: 138 mEq/L (ref 135–145)
Total Bilirubin: 0.5 mg/dL (ref 0.2–1.2)
Total Protein: 7.4 g/dL (ref 6.0–8.3)

## 2016-12-06 LAB — LIPID PANEL
Cholesterol: 156 mg/dL (ref 0–200)
HDL: 74.3 mg/dL (ref >39.00)
LDL Cholesterol: 74 mg/dL (ref 0–99)
NonHDL: 81.42
Total CHOL/HDL Ratio: 2
Triglycerides: 35 mg/dL (ref 0.0–149.0)
VLDL: 7 mg/dL (ref 0.0–40.0)

## 2016-12-06 NOTE — Addendum Note (Signed)
Addended by: Tawnya CrookSAMBATH, Justise Ehmann on: 12/06/2016 12:58 PM   Modules accepted: Orders

## 2016-12-06 NOTE — Assessment & Plan Note (Signed)
Following with orthopedics, recently underwent injections.

## 2016-12-06 NOTE — Patient Instructions (Signed)
Complete lab work prior to leaving today. I will notify you of your results once received.   Continue regular exercise and a healthy diet.  Ensure you are consuming 64 ounces of water daily.  Return to have your TB skin test read as directed.  Follow up in 1 year for your annual exam or sooner if needed.  It was a pleasure to see you today!

## 2016-12-06 NOTE — Assessment & Plan Note (Signed)
Immunizations UTD. Continue regular exercise, continue to work on diet with vegetables, fruit, whole grains. Exam unremarkable. Labs pending. TB test pending. Form completed except for TB result. Follow up in 1 year.

## 2016-12-06 NOTE — Assessment & Plan Note (Signed)
Doing well on Wellbutrin, continue same. 

## 2016-12-06 NOTE — Progress Notes (Signed)
Subjective:    Patient ID: Meghan Jones, female    DOB: 05-03-1980, 36 y.o.   MRN: [id number]  HPI  [name] is a 36 year old female who presents today for complete physical.  Immunizations: -Tetanus: Completed within 10 years.  -Influenza: Completed recently.   Diet: She endorses a fair diet. Breakfast: Eggs, smoothie, fruit Lunch: Salad with chicken, fruit, vegetables, [place] sandwich Dinner: Meat, vegetables Snacks: Nuts, fruit, occasional chips and salsa Desserts: Occasionally, twice weekly Beverages: Water, wine twice weekly  Exercise: She exercises 30 minutes-60 minutes most days of the week.  Eye exam: Completes annually  Dental exam: Completes semi-annually Pap: Hysterectomy    Review of Systems  Constitutional: Negative for unexpected weight change.  HENT: Negative for rhinorrhea.   Respiratory: Negative for cough and shortness of breath.   Cardiovascular: Negative for chest pain.  Gastrointestinal: Negative for constipation and diarrhea.  Genitourinary: Negative for difficulty urinating and menstrual problem.  Musculoskeletal: Negative for myalgias.       Chronic knee pain  Skin: Negative for rash.  Allergic/Immunologic: Negative for environmental allergies.  Neurological: Negative for dizziness, numbness and headaches.  Psychiatric/Behavioral:       Doing well on Wellbutrin.       Past Medical History:  Diagnosis Date  . Allergy   . Constipation    Controlled with Shakeology through [employer]  . Depression    Mild     Social History   Social History  . Marital status: Married    Spouse name: [name]  . Number of children: 1  . Years of education: 42   Occupational History  . [employer]   Social History Main Topics  . Smoking status: Never Smoker  . Smokeless tobacco: Never Used  . Alcohol use 1.2 oz/week    1 Glasses of wine, 1 Shots of liquor per week   Comment: Occassionally 2 drinks weekly  . Drug use: No  . Sexual activity: Yes    Birth control/ protection: Surgical   Other Topics Concern  . Not on file   Social History Narrative   Meghan Jones grew up in [place], [place].    She attended [address] [employer] [place] in [place] ([employer]) and obtained her bachelors in Psychologist, sport and exercise.    She then attended [employer] and obtained her Masters in [employer].    She lives at home with her husband [name]) and daughter Meghan Jones).    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      Past Surgical History:  Procedure Laterality Date  . ABDOMINAL HYSTERECTOMY  2011   Asherman's Syndrome  . APPENDECTOMY  2000    Family History  Problem Relation Age of Onset  . Hyperlipidemia Maternal Grandfather   . Hyperlipidemia Paternal Grandmother   . Heart disease Paternal Grandmother 76       CAD - Died MI  . Mental illness Paternal Grandmother   . Hypertension Paternal Grandmother   . Depression Paternal Grandmother        Suicide attempt x 3  . Obesity Paternal Grandmother   . Diabetes Paternal Grandmother   . Hyperlipidemia Paternal Grandfather   . Obesity Paternal Grandfather   . Hypertension Paternal Grandfather   . Diabetes Paternal Grandfather   . Depression Sister     Allergies  Allergen Reactions  . Levonorgestrel-Ethinyl Estrad Other (See Comments)    Per pt she had C-Diff    Current Outpatient Prescriptions  on File Prior to Visit  Medication Sig Dispense Refill  . buPROPion (WELLBUTRIN XL) 150 MG 24 hr tablet TAKE 1 TABLET(150 MG) BY MOUTH DAILY 90 tablet 3  . cetirizine-pseudoephedrine (ZYRTEC-D) 5-120 MG tablet Take 1 tablet by mouth daily.    . Multiple Vitamin (MULTIVITAMIN) tablet Take 1 tablet by mouth daily. Reported on 06/12/2015     No current facility-administered medications on file prior to visit.     BP 120/78   Pulse 65   Temp 98.5 F (36.9 C) (Oral)   Wt 170 lb 6.4 oz (77.3 kg)   SpO2 98%   BMI 27.71 kg/m     Objective:   Physical Exam  Constitutional: She is oriented to person, place, and time. She appears well-nourished.  HENT:  Right Ear: Tympanic membrane and ear canal normal.  Left Ear: Tympanic membrane and ear canal normal.  Nose: Nose normal.  Mouth/Throat: Oropharynx is clear and moist.  Eyes: Pupils are equal, round, and reactive to light. Conjunctivae and EOM are normal.  Neck: Neck supple. No thyromegaly present.  Cardiovascular: Normal rate and regular rhythm.   No murmur heard. Pulmonary/Chest: Effort normal and breath sounds normal. She has no rales.  Abdominal: Soft. Bowel sounds are normal. There is no tenderness.  Musculoskeletal: Normal range of motion.  Lymphadenopathy:    She has no cervical adenopathy.  Neurological: She is alert and oriented to person, place, and time. She has normal reflexes. No cranial nerve deficit.  Skin: Skin is warm and dry. No rash noted.  Psychiatric: She has a normal mood and affect.          Assessment & Plan:

## 2017-05-10 ENCOUNTER — Ambulatory Visit: Payer: BC Managed Care – PPO | Admitting: Primary Care

## 2017-05-10 ENCOUNTER — Encounter: Payer: Self-pay | Admitting: Primary Care

## 2017-05-10 VITALS — BP 104/74 | HR 68 | Temp 97.7°F | Ht 65.75 in | Wt 172.8 lb

## 2017-05-10 DIAGNOSIS — F419 Anxiety disorder, unspecified: Secondary | ICD-10-CM

## 2017-05-10 DIAGNOSIS — F329 Major depressive disorder, single episode, unspecified: Secondary | ICD-10-CM | POA: Diagnosis not present

## 2017-05-10 DIAGNOSIS — J302 Other seasonal allergic rhinitis: Secondary | ICD-10-CM | POA: Diagnosis not present

## 2017-05-10 DIAGNOSIS — F32A Depression, unspecified: Secondary | ICD-10-CM

## 2017-05-10 MED ORDER — BUPROPION HCL ER (XL) 300 MG PO TB24: 300.0000 mg | ORAL_TABLET | Freq: Every day | ORAL | 0 refills | Status: DC

## 2017-05-10 MED ORDER — MONTELUKAST SODIUM 10 MG PO TABS: 10.0000 mg | ORAL_TABLET | Freq: Every day | ORAL | 0 refills | Status: DC

## 2017-05-10 NOTE — Patient Instructions (Signed)
Start montelukast 10 mg tablets for allergies. Take 1 tablet by mouth at bedtime.  Stop taking Zyrtec-D.  We've increased your dose of bupropion from 150 mg to 300 mg. Take 1 tablet once daily.  Please update me in regards to your allergies in 1 week and anxiety symptoms in 3-4 weeks.  It was a pleasure to see you today!

## 2017-05-10 NOTE — Assessment & Plan Note (Signed)
Increase in anxiety symptoms for quite some time, increased difficulty managing symptoms over last 3-4 weeks.  GAD 7 score of 11 and PHQ 9 score of 8 today. Discussed different options for treatment including therapy vs increase in medication dose. 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   Will increase Wellbutrin to 300 mg daily, she will update in 3-4 weeks.

## 2017-05-10 NOTE — Progress Notes (Signed)
Subjective:    Patient ID: Meghan Jones, female    DOB: 1981/01/20, 37 y.o.   MRN: [id number]  HPI  Meghan Jones is a 37 year old female who presents today with multiple complaints.  1) Anxiety and Depression: Currently managed on bupropion XL 150 mg. She started a new job in Fall 2019 and has enjoyed the new job. Over the last 3-4 weeks she's been feeling anxious with chest tightness and shortness of breath which occurs most every morning while getting ready for work. She's actually been feeling this way for quite some time longer, but was always able to calm herself down quickly. Over the last several weeks it's been more difficult to calm down. She's found herself secluding herself from her husband and daughter in the morning as she'd doesn't want them to see her anxiety.  GAD 7 score of 11 and PHQ 9 score of 8 today.   2) Allergic Rhinitis: Currently managed on Zyrtec-D PRN for which she's taken for years. This season has caused worse symptoms such as sore throat, headaches, sinus pressure, congestion. These symptoms have been persistent for three weeks. She's noticed temporary improvement with Zyrtec-D daily for the past 2 weeks.   She was once managed on plain Claritin, then Claritin-D, then Allegra-D without improvement. She's never tried prescription strength medication. She denies fevers, chills, purulent mucous from her nasal cavity.  Review of Systems  Constitutional: Negative for chills and fever.  HENT: Positive for congestion, postnasal drip and sinus pressure. Negative for ear pain.   Respiratory: Negative for cough.   Allergic/Immunologic: Positive for environmental allergies.  Psychiatric/Behavioral: The patient is nervous/anxious.        See HPI       Past Medical History:  Diagnosis Date  . Allergy   . Constipation    Cont[facility] with Shakeology [facility] [employer]  . Depression    Mild     Social History   Socioeconomic History  . Marital status: Married   Spouse name: Weston Brassick  . Number of children: 1  . Years of education: 6418  . Highest education level: Not on file  Occupational History  . Occupation: Mining engineerpeech Therapist    Employer: [employer]    Comment: [employer]  Social Needs  . Financial resource strain: Not on file  . Food insecurity:    Worry: Not on file    Inability: Not on file  . Transportation needs:    Medical: Not on file    Non-medical: Not on file  Tobacco Use  . Smoking status: Never Smoker  . Smokeless tobacco: Never Used  Substance and Sexual Activity  . Alcohol use: Yes    Alcohol/week: 1.2 oz    Types: 1 Glasses of wine, 1 Shots of liquor per week    Comment: Occassionally 2 drinks weekly  . Drug use: No  . Sexual activity: Yes    Birth control/protection: Surgical  Lifestyle  . Physical activity:    Days per week: Not on file    Minutes per session: Not on file  . Stress: Not on file  Relationships  . Social connections:    Talks on phone: Not on file    Gets together: Not on file    Attends religious service: Not on file    Active member of club or organization: Not on file    Attends meetings of clubs or organizations: Not on file    Relationship status: Not on file  .  Intimate partner violence:    Fear of current or ex partner: Not on file    Emotionally abused: Not on file    Physically abused: Not on file    Forced sexual activity: Not on file  Other Topics Concern  . Not on file  Social History Narrative   Julyssa grew up in [place], [place].    She attended [address] [employer] [place] in [place] ([employer]) and obtained her bachelors in Psychologist, sport and exercise.    She then attended [employer] and obtained her Masters in [employer].    She lives at home with her husband [name]) and daughter [name]).    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      Past Surgical History:  Procedure Laterality Date  . ABDOMINAL HYSTERECTOMY  2011   Asherman's Syndrome  .  APPENDECTOMY  2000    Family History  Problem Relation Age of Onset  . Hyperlipidemia Maternal Grandfather   . Hyperlipidemia Paternal Grandmother   . Heart disease Paternal Grandmother 73       CAD - Died MI  . Mental illness Paternal Grandmother   . Hypertension Paternal Grandmother   . Depression Paternal Grandmother        Suicide attempt x 3  . Obesity Paternal Grandmother   . Diabetes Paternal Grandmother   . Hyperlipidemia Paternal Grandfather   . Obesity Paternal Grandfather   . Hypertension Paternal Grandfather   . Diabetes Paternal Grandfather   . Depression Sister     Allergies  Allergen Reactions  . Levonorgestrel-Ethinyl Estrad Other (See Comments)    Per pt she had C-Diff    Current Outpatient Medications on File Prior to Visit  Medication Sig Dispense Refill  . cetirizine-pseudoephedrine (ZYRTEC-D) 5-120 MG tablet Take 1 tablet by mouth daily.    . Multiple Vitamin (MULTIVITAMIN) tablet Take 1 tablet by mouth daily. Reported on 06/12/2015     No current facility-administered medications on file prior to visit.     BP 104/74   Pulse 68   Temp 97.7 F (36.5 C) (Oral)   Ht 5' 5.75" (1.67 m)   Wt 172 lb 12 oz (78.4 kg)   SpO2 98%   BMI 28.10 kg/m    Objective:   Physical Exam  Constitutional: She appears well-nourished. She does not appear ill.  HENT:  Right Ear: Tympanic membrane and ear canal normal.  Left Ear: Tympanic membrane and ear canal normal.  Nose: Mucosal edema present. Right sinus exhibits no maxillary sinus tenderness and no frontal sinus tenderness. Left sinus exhibits no maxillary sinus tenderness and no frontal sinus tenderness.  Mouth/Throat: Oropharynx is clear and moist.  Eyes: Conjunctivae are normal.  Neck: Neck supple.  Cardiovascular: Normal rate and regular rhythm.  Pulmonary/Chest: Effort normal and breath sounds normal. She has no wheezes. She has no rales.  Lymphadenopathy:    She has no cervical adenopathy.  Skin:  Skin is warm and dry.  Psychiatric:  Tearful at times during exam when talking about anxiety and depression symptoms.          Assessment & Plan:

## 2017-05-10 NOTE — Assessment & Plan Note (Signed)
Long standing history, failed numerous treatments in the past. Discussed to avoid long term use of decongestants containing sudafed, stop Zyrtec-D.  Will have her trial montelukast 10 mg nightly, she'll update in 1-2 weeks.

## 2017-05-13 ENCOUNTER — Encounter: Payer: Self-pay | Admitting: Primary Care

## 2017-06-28 ENCOUNTER — Encounter: Payer: Self-pay | Admitting: Primary Care

## 2017-07-27 ENCOUNTER — Other Ambulatory Visit: Payer: Self-pay | Admitting: Primary Care

## 2017-07-27 DIAGNOSIS — J302 Other seasonal allergic rhinitis: Secondary | ICD-10-CM

## 2017-07-27 DIAGNOSIS — F32A Depression, unspecified: Secondary | ICD-10-CM

## 2017-07-27 DIAGNOSIS — F419 Anxiety disorder, unspecified: Principal | ICD-10-CM

## 2017-07-27 DIAGNOSIS — F329 Major depressive disorder, single episode, unspecified: Secondary | ICD-10-CM

## 2017-07-27 MED ORDER — BUPROPION HCL ER (XL) 300 MG PO TB24: 300.0000 mg | ORAL_TABLET | Freq: Every day | ORAL | 1 refills | Status: DC

## 2017-07-27 MED ORDER — MONTELUKAST SODIUM 10 MG PO TABS: 10.0000 mg | ORAL_TABLET | Freq: Every day | ORAL | 1 refills | Status: DC

## 2017-11-22 ENCOUNTER — Encounter: Payer: Self-pay | Admitting: Family Medicine

## 2017-11-22 ENCOUNTER — Ambulatory Visit: Payer: BC Managed Care – PPO | Admitting: Family Medicine

## 2017-11-22 VITALS — BP 120/72 | HR 60 | Temp 98.3°F | Ht 65.75 in | Wt 168.0 lb

## 2017-11-22 DIAGNOSIS — J029 Acute pharyngitis, unspecified: Secondary | ICD-10-CM

## 2017-11-22 LAB — POCT RAPID STREP A

## 2017-11-22 NOTE — Patient Instructions (Signed)
For throat pain, can take ibuprofen 400-600 mg every 8 to 12 hours, can alternate with 2 Tylenol Try warm salt water gargles, Chloraseptic spray Rest and get lots of fluid Delsym or Robitussin DM for cough as needed  Pharyngitis Pharyngitis is redness, pain, and swelling (inflammation) of the throat (pharynx). It is a very common cause of sore throat. Pharyngitis can be caused by a bacteria, but it is usually caused by a virus. Most cases of pharyngitis get better on their own without treatment. What are the causes? This condition may be caused by:  Infection by viruses (viral). Viral pharyngitis spreads from person to person (is contagious) through coughing, sneezing, and sharing of personal items or utensils such as cups, forks, spoons, and toothbrushes.  Infection by bacteria (bacterial). Bacterial pharyngitis may be spread by touching the nose or face after coming in contact with the bacteria, or through more intimate contact, such as kissing.  Allergies. Allergies can cause buildup of mucus in the throat (post-nasal drip), leading to inflammation and irritation. Allergies can also cause blocked nasal passages, forcing breathing through the mouth, which dries and irritates the throat.  What increases the risk? You are more likely to develop this condition if:  You are 38-38 years old.  You are exposed to crowded environments such as daycare, school, or dormitory living.  You live in a cold climate.  You have a weakened disease-fighting (immune) system.  What are the signs or symptoms? Symptoms of this condition vary by the cause (viral, bacterial, or allergies) and can include:  Sore throat.  Fatigue.  Low-grade fever.  Headache.  Joint pain and muscle aches.  Skin rashes.  Swollen glands in the throat (lymph nodes).  Plaque-like film on the throat or tonsils. This is often a symptom of bacterial pharyngitis.  Vomiting.  Stuffy nose (nasal  congestion).  Cough.  Red, itchy eyes (conjunctivitis).  Loss of appetite.  How is this diagnosed? This condition is often diagnosed based on your medical history and a physical exam. Your health care provider will ask you questions about your illness and your symptoms. A swab of your throat may be done to check for bacteria (rapid strep test). Other lab tests may also be done, depending on the suspected cause, but these are rare. How is this treated? This condition usually gets better in 3-4 days without medicine. Bacterial pharyngitis may be treated with antibiotic medicines. Follow these instructions at home:  Take over-the-counter and prescription medicines only as told by your health care provider. ? If you were prescribed an antibiotic medicine, take it as told by your health care provider. Do not stop taking the antibiotic even if you start to feel better. ? Do not give children aspirin because of the association with Reye syndrome.  Drink enough water and fluids to keep your urine clear or pale yellow.  Get a lot of rest.  Gargle with a salt-water mixture 3-4 times a day or as needed. To make a salt-water mixture, completely dissolve -1 tsp of salt in 1 cup of warm water.  If your health care provider approves, you may use throat lozenges or sprays to soothe your throat. Contact a health care provider if:  You have large, tender lumps in your neck.  You have a rash.  You cough up green, yellow-brown, or bloody spit. Get help right away if:  Your neck becomes stiff.  You drool or are unable to swallow liquids.  You cannot drink or take medicines without  vomiting.  You have severe pain that does not go away, even after you take medicine.  You have trouble breathing, and it is not caused by a stuffy nose.  You have new pain and swelling in your joints such as the knees, ankles, wrists, or elbows. Summary  Pharyngitis is redness, pain, and swelling (inflammation)  of the throat (pharynx).  While pharyngitis can be caused by a bacteria, the most common causes are viral.  Most cases of pharyngitis get better on their own without treatment.  Bacterial pharyngitis is treated with antibiotic medicines. This information is not intended to replace advice given to you by your health care provider. Make sure you discuss any questions you have with your health care provider. Document Released: 01/31/2005 Document Revised: 03/08/2016 Document Reviewed: 03/08/2016 Elsevier Interactive Patient Education  [employer].

## 2017-11-22 NOTE — Progress Notes (Signed)
Subjective:    Patient ID: Meghan Jones, female    DOB: 03/16/1980, 37 y.o.   MRN: [id number]  HPI This is a 37 yo female who presents today with cough and sore throat x 4 days. Started with headache which is mostly gone. Sore throat feels like needles. Little sputum production. No fevers. No ear pain. Able to eat and drink. Has taken some Tylenol with little relief. She is a Doctor, general practice in an elementary school. No known strep exposure.   Past Medical History:  Diagnosis Date  . Allergy   . Constipation    Controlled with Shakeology through [employer]  . Depression    Mild   Past Surgical History:  Procedure Laterality Date  . ABDOMINAL HYSTERECTOMY  2011   Asherman's Syndrome  . APPENDECTOMY  2000   Family History  Problem Relation Age of Onset  . Hyperlipidemia Maternal Grandfather   . Hyperlipidemia Paternal Grandmother   . Heart disease Paternal Grandmother 29       CAD - Died MI  . Mental illness Paternal Grandmother   . Hypertension Paternal Grandmother   . Depression Paternal Grandmother        Suicide attempt x 3  . Obesity Paternal Grandmother   . Diabetes Paternal Grandmother   . Hyperlipidemia Paternal Grandfather   . Obesity Paternal Grandfather   . Hypertension Paternal Grandfather   . Diabetes Paternal Grandfather   . Depression Sister    Social History   Tobacco Use  . Smoking status: Never Smoker  . Smokeless tobacco: Never Used  Substance Use Topics  . Alcohol use: Yes    Alcohol/week: 2.0 standard drinks    Types: 1 Glasses of wine, 1 Shots of liquor per week    Comment: Occassionally 2 drinks weekly  . Drug use: No      Review of Systems    per HPI Objective:   Physical Exam  Constitutional: She is oriented to person, place, and time. She appears well-developed and well-nourished.  Non-toxic appearance. She appears ill. No distress.  Hoarse voice.   HENT:  Head: Normocephalic and atraumatic.  Right Ear: Tympanic membrane  and ear canal normal.  Left Ear: Tympanic membrane and ear canal normal.  Mouth/Throat: Uvula is midline and mucous membranes are normal. No uvula swelling. Posterior oropharyngeal erythema present. No oropharyngeal exudate, posterior oropharyngeal edema or tonsillar abscesses.  Neck: Normal range of motion.  Cardiovascular: Normal rate, regular rhythm and normal heart sounds.  Pulmonary/Chest: Effort normal and breath sounds normal.  Lymphadenopathy:    She has no cervical adenopathy.  Neurological: She is alert and oriented to person, place, and time.  Skin: Skin is warm and dry.  Psychiatric: She has a normal mood and affect. Her behavior is normal.  Vitals reviewed.     BP 120/72 (BP Location: Left Arm, Patient Position: Sitting, Cuff Size: Normal)   Pulse 60   Temp 98.3 F (36.8 C) (Oral)   Ht 5' 5.75" (1.67 m)   Wt 168 lb (76.2 kg)   SpO2 98%   BMI 27.32 kg/m      Results for orders placed or performed in visit on 11/22/17  POCT Rapid Strep A  Result Value Ref Range   Negative      Assessment & Plan:  1. Sore throat - POCT Rapid Strep A - Culture, Group A Strep  2. Pharyngitis, unspecified etiology - likely viral with rapid strep negative, but will send culture for confirmation - Provided  written and verbal information regarding diagnosis and treatment. - RTC precautions reviewed   Olean Ree, FNP-BC   Primary Care at [facility], [facility]  11/22/2017 10:39 AM

## 2017-11-24 ENCOUNTER — Telehealth: Payer: Self-pay | Admitting: Primary Care

## 2017-11-24 LAB — CULTURE, GROUP A STREP
MICRO NUMBER:: 91214560
SPECIMEN QUALITY:: ADEQUATE

## 2017-11-24 NOTE — Telephone Encounter (Signed)
Copied from CRM [id number]. Topic: Quick Communication - Lab Results (Clinic Use ONLY) >> Nov 24, 2017 12:34 PM Lutricia Horsfall, [place] wrote: Called patient to inform them of  lab results. When patient returns call, triage nurse may disclose results.

## 2017-11-27 ENCOUNTER — Encounter: Payer: Self-pay | Admitting: Family Medicine

## 2017-11-27 NOTE — Telephone Encounter (Signed)
Pt is calling back and would like the lab results. Pt said ok to leave results on vm

## 2018-02-06 ENCOUNTER — Other Ambulatory Visit: Payer: Self-pay | Admitting: Primary Care

## 2018-02-06 DIAGNOSIS — J302 Other seasonal allergic rhinitis: Secondary | ICD-10-CM

## 2018-03-14 ENCOUNTER — Other Ambulatory Visit: Payer: Self-pay | Admitting: Primary Care

## 2018-03-14 DIAGNOSIS — F3342 Major depressive disorder, recurrent, in full remission: Secondary | ICD-10-CM

## 2018-03-22 ENCOUNTER — Telehealth: Payer: Self-pay | Admitting: Primary Care

## 2018-03-22 ENCOUNTER — Other Ambulatory Visit: Payer: Self-pay | Admitting: Primary Care

## 2018-03-22 DIAGNOSIS — Z0000 Encounter for general adult medical examination without abnormal findings: Secondary | ICD-10-CM

## 2018-03-22 DIAGNOSIS — F329 Major depressive disorder, single episode, unspecified: Secondary | ICD-10-CM

## 2018-03-22 DIAGNOSIS — F32A Depression, unspecified: Secondary | ICD-10-CM

## 2018-03-22 DIAGNOSIS — F419 Anxiety disorder, unspecified: Principal | ICD-10-CM

## 2018-03-22 MED ORDER — BUPROPION HCL ER (XL) 300 MG PO TB24: 300.0000 mg | ORAL_TABLET | Freq: Every day | ORAL | 0 refills | Status: DC

## 2018-03-22 NOTE — Telephone Encounter (Signed)
Refill has been send to [facility] as requested.

## 2018-03-22 NOTE — Telephone Encounter (Signed)
Pt called office requesting a refill on the Wellbutrin. Pt is scheduled for her physical on 04/03/18. Pt uses [facility] at [address], [address].

## 2018-03-22 NOTE — Addendum Note (Signed)
Addended by: Tawnya Crook on: 03/22/2018 12:08 PM   Modules accepted: Orders

## 2018-03-27 ENCOUNTER — Other Ambulatory Visit (INDEPENDENT_AMBULATORY_CARE_PROVIDER_SITE_OTHER): Payer: BC Managed Care – PPO

## 2018-03-27 DIAGNOSIS — Z0000 Encounter for general adult medical examination without abnormal findings: Secondary | ICD-10-CM | POA: Diagnosis not present

## 2018-03-28 LAB — LIPID PANEL
Cholesterol: 167 mg/dL (ref 0–200)
HDL: 61.6 mg/dL (ref >39.00)
LDL Cholesterol: 96 mg/dL (ref 0–99)
NonHDL: 105.21
Total CHOL/HDL Ratio: 3
Triglycerides: 44 mg/dL (ref 0.0–149.0)
VLDL: 8.8 mg/dL (ref 0.0–40.0)

## 2018-03-28 LAB — COMPREHENSIVE METABOLIC PANEL
ALT: 17 U/L (ref 0–35)
AST: 20 U/L (ref 0–37)
Albumin: 4.8 g/dL (ref 3.5–5.2)
Alkaline Phosphatase: 50 U/L (ref 39–117)
BUN: 17 mg/dL (ref 6–23)
CO2: 28 mEq/L (ref 19–32)
Calcium: 9.6 mg/dL (ref 8.4–10.5)
Chloride: 100 mEq/L (ref 96–112)
Creatinine, Ser: 1.13 mg/dL (ref 0.40–1.20)
GFR: 53.71 mL/min — ABNORMAL LOW (ref >60.00)
Glucose, Bld: 78 mg/dL (ref 70–99)
Potassium: 4.3 mEq/L (ref 3.5–5.1)
Sodium: 138 mEq/L (ref 135–145)
Total Bilirubin: 0.6 mg/dL (ref 0.2–1.2)
Total Protein: 7.3 g/dL (ref 6.0–8.3)

## 2018-03-28 LAB — HEMOGLOBIN A1C: Hgb A1c MFr Bld: 5.4 % (ref 4.6–6.5)

## 2018-04-03 ENCOUNTER — Ambulatory Visit (INDEPENDENT_AMBULATORY_CARE_PROVIDER_SITE_OTHER): Payer: BC Managed Care – PPO | Admitting: Primary Care

## 2018-04-03 ENCOUNTER — Encounter: Payer: Self-pay | Admitting: Primary Care

## 2018-04-03 VITALS — BP 120/70 | HR 64 | Temp 98.0°F | Ht 65.75 in | Wt 161.8 lb

## 2018-04-03 DIAGNOSIS — N289 Disorder of kidney and ureter, unspecified: Secondary | ICD-10-CM

## 2018-04-03 DIAGNOSIS — G8929 Other chronic pain: Secondary | ICD-10-CM

## 2018-04-03 DIAGNOSIS — R51 Headache: Secondary | ICD-10-CM

## 2018-04-03 DIAGNOSIS — M25562 Pain in left knee: Secondary | ICD-10-CM

## 2018-04-03 DIAGNOSIS — R519 Headache, unspecified: Secondary | ICD-10-CM | POA: Insufficient documentation

## 2018-04-03 DIAGNOSIS — Z0000 Encounter for general adult medical examination without abnormal findings: Secondary | ICD-10-CM | POA: Diagnosis not present

## 2018-04-03 DIAGNOSIS — F419 Anxiety disorder, unspecified: Secondary | ICD-10-CM | POA: Diagnosis not present

## 2018-04-03 DIAGNOSIS — J302 Other seasonal allergic rhinitis: Secondary | ICD-10-CM

## 2018-04-03 DIAGNOSIS — F329 Major depressive disorder, single episode, unspecified: Secondary | ICD-10-CM

## 2018-04-03 DIAGNOSIS — M25561 Pain in right knee: Secondary | ICD-10-CM

## 2018-04-03 DIAGNOSIS — F32A Depression, unspecified: Secondary | ICD-10-CM

## 2018-04-03 HISTORY — DX: Disorder of kidney and ureter, unspecified: N28.9

## 2018-04-03 NOTE — Assessment & Plan Note (Signed)
Immunizations UTD. Commended her on weight loss through healthy diet and exercise, encouraged her to continue. Exam unremarkable. Labs reviewed. Follow up in 1 year for CPE.

## 2018-04-03 NOTE — Assessment & Plan Note (Signed)
Doing very well on increased dose of Wellbutrin XL 300 mg, continue same. Denies SI/HI.

## 2018-04-03 NOTE — Assessment & Plan Note (Signed)
GFR of 53 on recent labs which is not her normal. Infrequent NSAID use, endorses proper water hydration. Will start by repeating BMP then consider nephrology evaluation if needed.

## 2018-04-03 NOTE — Assessment & Plan Note (Signed)
Prior history of years ago, frequent headaches over the last one month. No migraines. Will have her start by resuming Singulair for allergies, she agrees. She will update if headaches persist.

## 2018-04-03 NOTE — Assessment & Plan Note (Signed)
Significant improvement since surgery. Commended her on weight loss and regular exercise.

## 2018-04-03 NOTE — Assessment & Plan Note (Signed)
Recent frequent headaches that could be allergy related. Will have her resume singulair daily and update.

## 2018-04-03 NOTE — Progress Notes (Signed)
Subjective:    Patient ID: Meghan Jones, female    DOB: [birth year], 38 y.o.   MRN: [id number]  HPI  Meghan Jones is a 38 year old female who presents today for complete physical.  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  Eye exam: Completed in 2019 Dental exam: Completes semi-annually  Pap Smear: Hysterectomy   Wt Readings from Last 3 Encounters:  04/03/18 161 lb 12 oz (73.4 kg)  11/22/17 168 lb (76.2 kg)  05/10/17 172 lb 12 oz (78.4 kg)     Review of Systems  Constitutional: Negative for unexpected weight change.  HENT: Negative for rhinorrhea.   Eyes: Negative for photophobia.  Respiratory: Negative for cough and shortness of breath.   Cardiovascular: Negative for chest pain.  Gastrointestinal: Negative for constipation and diarrhea.  Genitourinary: Negative for difficulty urinating and menstrual problem.  Musculoskeletal: Negative for arthralgias.       Occasional right hip pain  Skin: Negative for rash.  Allergic/Immunologic: Negative for environmental allergies.  Neurological: Positive for headaches. Negative for dizziness and numbness.       Headaches several times weekly for the last month, history of migraines in the past and no recent migraine.   Psychiatric/Behavioral: The patient is not nervous/anxious.        Past Medical History:  Diagnosis Date  . Allergy   . Constipation    Controlled with Shakeology through [employer]  . Depression    Mild     Social History   Socioeconomic History  .  Marital status: Married    Spouse name: Meghan Jones  . Number of children: 1  . Years of education: 72  . Highest education level: Not on file  Occupational History  . 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 Financial resource strain: Not on file  . Food insecurity:    Worry: Not on file    Inability: Not on file  . Transportation needs:    Medical: Not on file    Non-medical: Not on file  Tobacco Use  . Smoking status: Never Smoker  . Smokeless tobacco: Never Used  Substance and Sexual Activity  . Alcohol use: Yes    Alcohol/week: 2.0 standard drinks    Types: 1 Glasses of wine, 1 Shots of liquor per week    Comment: Occassionally 2 drinks weekly  . Drug use: No  . Sexual activity: Yes    Birth control/protection: Surgical  Lifestyle  . Physical activity:    Days per week: Not on file    Minutes per session: Not on file  . Stress: Not on file  Relationships  . Social connections:    Talks on phone: Not on file    Gets together: Not on file    Attends religious service: Not on file    Active member of club or organization: Not on file    Attends meetings of clubs or organizations: Not on file    Relationship status: Not on file  . Intimate partner violence:    Fear  of current or ex partner: Not on file    Emotionally abused: Not on file    Physically abused: Not on file    Forced sexual activity: Not on file  Other Topics Concern  . Not on file  Social History Narrative   Meghan Jones grew up in [place], [place].    She attended [address] [employer] [place] in [place] ([employer]) and obtained her bachelors in Psychologist, sport and exercise.    She then attended [employer] and obtained her Masters in [employer].    She lives at home with her husband Meghan Jones) and daughter Meghan Jones).    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      Past Surgical History:  Procedure Laterality Date  . ABDOMINAL  HYSTERECTOMY  2011   Meghan Jones  . APPENDECTOMY  2000    Family History  Problem Relation Age of Onset  . Hyperlipidemia Maternal Grandfather   . Hyperlipidemia Paternal Grandmother   . Heart disease Paternal Grandmother 31       CAD - Died MI  . Mental illness Paternal Grandmother   . Hypertension Paternal Grandmother   . Depression Paternal Grandmother        Suicide attempt x 3  . Obesity Paternal Grandmother   . Diabetes Paternal Grandmother   . Hyperlipidemia Paternal Grandfather   . Obesity Paternal Grandfather   . Hypertension Paternal Grandfather   . Diabetes Paternal Grandfather   . Depression Sister     No Known Allergies  Current Outpatient Medications on File Prior to Visit  Medication Sig Dispense Refill  . buPROPion (WELLBUTRIN XL) 300 MG 24 hr tablet Take 1 tablet (300 mg total) by mouth daily. 90 tablet 0  . montelukast (SINGULAIR) 10 MG tablet TAKE 1 TABLET(10 MG) BY MOUTH AT BEDTIME 90 tablet 0  . Multiple Vitamin (MULTIVITAMIN) tablet Take 1 tablet by mouth daily. Reported on 06/12/2015     No current facility-administered medications on file prior to visit.     BP 120/70   Pulse 64   Temp 98 F (36.7 C) (Oral)   Ht 5' 5.75" (1.67 m)   Wt 161 lb 12 oz (73.4 kg)   SpO2 98%   BMI 26.31 kg/m    Objective:   Physical Exam  Constitutional: She is oriented to person, place, and time. She appears well-nourished.  HENT:  Mouth/Throat: No oropharyngeal exudate.  Eyes: Pupils are equal, round, and reactive to light. EOM are normal.  Neck: Neck supple. No thyromegaly present.  Cardiovascular: Normal rate and regular rhythm.  Respiratory: Effort normal and breath sounds normal.  GI: Soft. Bowel sounds are normal. There is no abdominal tenderness.  Musculoskeletal: Normal range of motion.  Neurological: She is alert and oriented to person, place, and time.  Skin: Skin is warm and dry.  Psychiatric: She has a normal mood and affect.            Assessment & Plan:

## 2018-04-03 NOTE — Patient Instructions (Signed)
Continue exercising. You should be getting 150 minutes of moderate intensity exercise weekly.  Continue to work on a healthy diet, congratulations on your weight loss!  Please update me if your headaches persist despite Singulair.   We will see you next year or sooner if needed.  It was a pleasure to see you today!   Preventive Care 18-39 Years, Female Preventive care refers to lifestyle choices and visits with your health care provider that can promote health and wellness. What does preventive care include?   A yearly physical exam. This is also called an annual well check.  Dental exams once or twice a year.  Routine eye exams. Ask your health care provider how often you should have your eyes checked.  Personal lifestyle choices, including: ? Daily care of your teeth and gums. ? Regular physical activity. ? Eating a healthy diet. ? Avoiding tobacco and drug use. ? Limiting alcohol use. ? Practicing safe sex. ? Taking vitamin and mineral supplements as recommended by your health care provider. What happens during an annual well check? The services and screenings done by your health care provider during your annual well check will depend on your age, overall health, lifestyle risk factors, and family history of disease. Counseling Your health care provider may ask you questions about your:  Alcohol use.  Tobacco use.  Drug use.  Emotional well-being.  Home and relationship well-being.  Sexual activity.  Eating habits.  Work and work Statistician.  Method of birth control.  Menstrual cycle.  Pregnancy history. Screening You may have the following tests or measurements:  Height, weight, and BMI.  Diabetes screening. This is done by checking your blood sugar (glucose) after you have not eaten for a while (fasting).  Blood pressure.  Lipid and cholesterol levels. These may be checked every 5 years starting at age 39.  Skin check.  Hepatitis C blood  test.  Hepatitis B blood test.  Sexually transmitted disease (STD) testing.  BRCA-related cancer screening. This may be done if you have a family history of breast, ovarian, tubal, or peritoneal cancers.  Pelvic exam and Pap test. This may be done every 3 years starting at age 51. Starting at age 15, this may be done every 5 years if you have a Pap test in combination with an HPV test. Discuss your test results, treatment options, and if necessary, the need for more tests with your health care provider. Vaccines Your health care provider may recommend certain vaccines, such as:  Influenza vaccine. This is recommended every year.  Tetanus, diphtheria, and acellular pertussis (Tdap, Td) vaccine. You may need a Td booster every 10 years.  Varicella vaccine. You may need this if you have not been vaccinated.  HPV vaccine. If you are 22 or younger, you may need three doses over 6 months.  Measles, mumps, and rubella (MMR) vaccine. You may need at least one dose of MMR. You may also need a second dose.  Pneumococcal 13-valent conjugate (PCV13) vaccine. You may need this if you have certain conditions and were not previously vaccinated.  Pneumococcal polysaccharide (PPSV23) vaccine. You may need one or two doses if you smoke cigarettes or if you have certain conditions.  Meningococcal vaccine. One dose is recommended if you are age 36-21 years and a first-year college student living in a residence hall, or if you have one of several medical conditions. You may also need additional booster doses.  Hepatitis A vaccine. You may need this if you have certain  conditions or if you travel or work in places where you may be exposed to hepatitis A.  Hepatitis B vaccine. You may need this if you have certain conditions or if you travel or work in places where you may be exposed to hepatitis B.  Haemophilus influenzae type b (Hib) vaccine. You may need this if you have certain risk factors. Talk to  your health care provider about which screenings and vaccines you need and how often you need them. This information is not intended to replace advice given to you by your health care provider. Make sure you discuss any questions you have with your health care provider. Document Released: 03/29/2001 Document Revised: 09/13/2016 Document Reviewed: 12/02/2014 Elsevier Interactive Patient Education  2019 [employer].

## 2018-04-04 ENCOUNTER — Other Ambulatory Visit: Payer: Self-pay | Admitting: Primary Care

## 2018-04-04 ENCOUNTER — Ambulatory Visit
Admission: RE | Admit: 2018-04-04 | Discharge: 2018-04-04 | Disposition: A | Discharge status: Home / Self Care | Payer: BC Managed Care – PPO | Source: Ambulatory Visit | Attending: Primary Care | Admitting: Primary Care

## 2018-04-04 DIAGNOSIS — N289 Disorder of kidney and ureter, unspecified: Secondary | ICD-10-CM | POA: Diagnosis present

## 2018-04-04 LAB — BASIC METABOLIC PANEL
BUN: 16 mg/dL (ref 6–23)
CO2: 29 mEq/L (ref 19–32)
Calcium: 9.6 mg/dL (ref 8.4–10.5)
Chloride: 104 mEq/L (ref 96–112)
Creatinine, Ser: 1.34 mg/dL — ABNORMAL HIGH (ref 0.40–1.20)
GFR: 44.11 mL/min — ABNORMAL LOW (ref >60.00)
Glucose, Bld: 85 mg/dL (ref 70–99)
Potassium: 4.1 mEq/L (ref 3.5–5.1)
Sodium: 139 mEq/L (ref 135–145)

## 2018-04-04 NOTE — Telephone Encounter (Signed)
Meghan Jones, we may need to hold off on the stat nephrology referral for now as I have found the likely culprit for her decreased renal function. I plan to repeat her labs in 1 week and see if there is any change, if there is not a change in her labs then I will still need the referral.

## 2018-04-13 ENCOUNTER — Other Ambulatory Visit (INDEPENDENT_AMBULATORY_CARE_PROVIDER_SITE_OTHER): Payer: BC Managed Care – PPO

## 2018-04-13 DIAGNOSIS — N289 Disorder of kidney and ureter, unspecified: Secondary | ICD-10-CM

## 2018-04-13 NOTE — Addendum Note (Signed)
Addended by: Alvina Chou on: 04/13/2018 04:02 PM   Modules accepted: Orders

## 2018-04-14 LAB — BASIC METABOLIC PANEL
BUN/Creatinine Ratio: 20 (calc) (ref 6–22)
BUN: 24 mg/dL (ref 7–25)
CO2: 27 mmol/L (ref 20–32)
Calcium: 9.5 mg/dL (ref 8.6–10.2)
Chloride: 104 mmol/L (ref 98–110)
Creat: 1.21 mg/dL — ABNORMAL HIGH (ref 0.50–1.10)
Glucose, Bld: 94 mg/dL (ref 65–99)
Potassium: 4.4 mmol/L (ref 3.5–5.3)
Sodium: 139 mmol/L (ref 135–146)

## 2018-04-15 ENCOUNTER — Other Ambulatory Visit: Payer: Self-pay | Admitting: Primary Care

## 2018-04-15 DIAGNOSIS — N289 Disorder of kidney and ureter, unspecified: Secondary | ICD-10-CM

## 2018-04-16 LAB — BASIC METABOLIC PANEL WITH GFR
BUN/Creatinine Ratio: 20 (calc) (ref 6–22)
BUN: 24 mg/dL (ref 7–25)
CO2: 27 mmol/L (ref 20–32)
Calcium: 9.5 mg/dL (ref 8.6–10.2)
Chloride: 104 mmol/L (ref 98–110)
Creat: 1.21 mg/dL — ABNORMAL HIGH (ref 0.50–1.10)
GFR, Est African American: 66 mL/min/{1.73_m2} (ref >=60)
GFR, Est Non African American: 57 mL/min/{1.73_m2} — ABNORMAL LOW (ref >=60)
Glucose, Bld: 94 mg/dL (ref 65–99)
Potassium: 4.4 mmol/L (ref 3.5–5.3)
Sodium: 139 mmol/L (ref 135–146)

## 2018-04-16 LAB — TEST AUTHORIZATION

## 2018-05-16 ENCOUNTER — Other Ambulatory Visit: Payer: Self-pay | Admitting: Primary Care

## 2018-05-16 DIAGNOSIS — J302 Other seasonal allergic rhinitis: Secondary | ICD-10-CM

## 2018-06-12 ENCOUNTER — Other Ambulatory Visit: Payer: Self-pay | Admitting: Primary Care

## 2018-06-12 DIAGNOSIS — F419 Anxiety disorder, unspecified: Principal | ICD-10-CM

## 2018-06-12 DIAGNOSIS — F32A Depression, unspecified: Secondary | ICD-10-CM

## 2018-06-12 DIAGNOSIS — F329 Major depressive disorder, single episode, unspecified: Secondary | ICD-10-CM

## 2018-09-10 ENCOUNTER — Ambulatory Visit: Payer: BC Managed Care – PPO | Admitting: Primary Care

## 2018-12-08 ENCOUNTER — Inpatient Hospital Stay
Admission: RE | Admit: 2018-12-08 | Discharge: 2018-12-08 | Disposition: A | Discharge status: Home / Self Care | Payer: BC Managed Care – PPO | Source: Ambulatory Visit

## 2018-12-10 ENCOUNTER — Encounter: Payer: Self-pay | Admitting: Primary Care

## 2018-12-10 ENCOUNTER — Other Ambulatory Visit: Payer: Self-pay

## 2018-12-10 ENCOUNTER — Ambulatory Visit: Payer: BC Managed Care – PPO | Admitting: Primary Care

## 2018-12-10 VITALS — BP 128/82 | HR 78 | Temp 97.4°F | Ht 65.75 in | Wt 174.2 lb

## 2018-12-10 DIAGNOSIS — Z23 Encounter for immunization: Secondary | ICD-10-CM

## 2018-12-10 DIAGNOSIS — R519 Headache, unspecified: Secondary | ICD-10-CM | POA: Diagnosis not present

## 2018-12-10 DIAGNOSIS — G43909 Migraine, unspecified, not intractable, without status migrainosus: Secondary | ICD-10-CM | POA: Diagnosis not present

## 2018-12-10 MED ORDER — SUMATRIPTAN SUCCINATE 25 MG PO TABS: ORAL_TABLET | ORAL | 0 refills | Status: DC

## 2018-12-10 MED ORDER — KETOROLAC TROMETHAMINE 60 MG/2ML IM SOLN
60.0000 mg | Freq: Once | INTRAMUSCULAR | Status: AC
  Administered 2018-12-10: 60 mg via INTRAMUSCULAR

## 2018-12-10 NOTE — Addendum Note (Signed)
Addended by: Jacqualin Combes on: 12/10/2018 11:37 AM   Modules accepted: Orders

## 2018-12-10 NOTE — Assessment & Plan Note (Signed)
Ongoing headache with migraine properties. IM Toradol provide today. Rx for sumatriptan sent to pharmacy to use PRN. She will update if migraines become more frequent.

## 2018-12-10 NOTE — Assessment & Plan Note (Signed)
History of headaches that date back to childhood. As of now headaches are once monthly on average, discussed that if headaches become more frequent then consider daily preventative treatment.  Rx for sumatriptan provided to use PRN. She will update.

## 2018-12-10 NOTE — Progress Notes (Signed)
Subjective:    Patient ID: Meghan Jones, female    DOB: 1980/09/19, 38 y.o.   MRN: [id number]  HPI  Meghan Jones is a 38 year old female with a history of seasonal allergies, frequent headaches, migraines, anxiety who presents today with a chief complaint of headache.  Her headache is located to the bilateral frontal lobes, behind her eyes, tops of parietal lobes at times. Symptoms began about 11 days ago. She's had some nausea, photophobia, phonophobia at times. She describes her pain as sharp, dull/achy, throbbing at times. She's Excedrin Migraine, Advil, and Tylenol without improvement.   She denies fevers, expulsion of thick green mucous, sore throat, otalgia.   She has a history of migraines and frequent headaches, once seeing neurologist and managed on beta blocker and Imitrex. Headaches are once monthly on average.   Review of Systems  Constitutional: Negative for fatigue and fever.  HENT: Negative for ear pain, sinus pressure and sore throat.   Eyes: Positive for photophobia.  Respiratory: Negative for cough.   Gastrointestinal: Positive for nausea.  Neurological: Positive for headaches.       Past Medical History:  Diagnosis Date  . Allergy   . Constipation    Controlled with Shakeology through [employer]  . Depression    Mild     Social History   Socioeconomic History  . Marital status: Married    Spouse name: [name]  . Number of children: 1  . Years of education: 49  . Highest education level: Not on file  Occupational History  . 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 Financial resource strain: Not on file  . Food insecurity    Worry: Not on file    Inability: Not on file  . Transportation needs    Medical: Not on file    Non-medical: Not on file  Tobacco Use  . Smoking status: Never Smoker  . Smokeless tobacco: Never Used  Substance and Sexual Activity   . Alcohol use: Yes    Alcohol/week: 2.0 standard drinks    Types: 1 Glasses of wine, 1 Shots of liquor per week    Comment: Occassionally 2 drinks weekly  . Drug use: No  . Sexual activity: Yes    Birth control/protection: Surgical  Lifestyle  . Physical activity    Days per week: Not on file    Minutes per session: Not on file  . Stress: Not on file  Relationships  . Social Musician on phone: Not on file    Gets together: Not on file    Attends religious service: Not on file    Active member of club or organization: Not on file    Attends meetings of clubs or organizations: Not on file    Relationship status: Not on file  . Intimate partner violence    Fear of current or ex partner: Not on file    Emotionally abused: Not on file    Physically abused: Not on file    Forced sexual activity: Not on file  Other Topics Concern  . Not on file  Social History Narrative   Meghan Jones grew up in [place], [place].    She attended [address] [employer] [place] in [place] ([employer]) and obtained her bachelors in Psychologist, sport and exercise.    She then attended [employer] and obtained her Masters in [employer].    She lives  at home with her husband Meghan Jones) and daughter Meghan Jones).    She enjoys working out Education officer, community.      Past Surgical History:  Procedure Laterality Date  . ABDOMINAL HYSTERECTOMY  2011   Asherman's Syndrome  . APPENDECTOMY  2000    Family History  Problem Relation Age of Onset  . Hyperlipidemia Maternal Grandfather   . Hyperlipidemia Paternal Grandmother   . Heart disease Paternal Grandmother 53       CAD - Died MI  . Mental illness Paternal Grandmother   . Hypertension Paternal Grandmother   . Depression Paternal Grandmother        Suicide attempt x 3  . Obesity Paternal Grandmother   . Diabetes Paternal Grandmother   . Hyperlipidemia Paternal Grandfather   . Obesity Paternal Grandfather   . Hypertension Paternal Grandfather   . Diabetes Paternal Grandfather    . Depression Sister     No Known Allergies  Current Outpatient Medications on File Prior to Visit  Medication Sig Dispense Refill  . buPROPion (WELLBUTRIN XL) 300 MG 24 hr tablet TAKE 1 TABLET(300 MG) BY MOUTH DAILY 90 tablet 1  . montelukast (SINGULAIR) 10 MG tablet TAKE 1 TABLET(10 MG) BY MOUTH AT BEDTIME 90 tablet 1  . Multiple Vitamin (MULTIVITAMIN) tablet Take 1 tablet by mouth daily. Reported on 06/12/2015     No current facility-administered medications on file prior to visit.     BP 128/82   Pulse 78   Temp (!) 97.4 F (36.3 C) (Temporal)   Ht 5' 5.75" (1.67 m)   Wt 174 lb 4 oz (79 kg)   SpO2 98%   BMI 28.34 kg/m    Objective:   Physical Exam  Constitutional: She is oriented to person, place, and time. She appears well-nourished.  Neck: Neck supple.  Cardiovascular: Normal rate and regular rhythm.  Respiratory: Effort normal and breath sounds normal.  Neurological: She is alert and oriented to person, place, and time.  Skin: Skin is warm and dry.  Psychiatric: She has a normal mood and affect.           Assessment & Plan:

## 2018-12-10 NOTE — Patient Instructions (Signed)
Please notify me if your headaches/migraines become more frequent.  You may take the sumatriptan at migraine onset, may repeat with second tablet two hours later if migraine persists.   It was a pleasure to see you today!

## 2018-12-23 ENCOUNTER — Other Ambulatory Visit: Payer: Self-pay | Admitting: Primary Care

## 2018-12-23 DIAGNOSIS — F32A Depression, unspecified: Secondary | ICD-10-CM

## 2018-12-23 DIAGNOSIS — F329 Major depressive disorder, single episode, unspecified: Secondary | ICD-10-CM

## 2019-03-15 ENCOUNTER — Ambulatory Visit (INDEPENDENT_AMBULATORY_CARE_PROVIDER_SITE_OTHER): Payer: BC Managed Care – PPO | Admitting: Family Medicine

## 2019-03-15 ENCOUNTER — Telehealth: Payer: Self-pay

## 2019-03-15 ENCOUNTER — Encounter: Payer: Self-pay | Admitting: Family Medicine

## 2019-03-15 VITALS — Temp 97.2°F | Ht 65.75 in

## 2019-03-15 DIAGNOSIS — R0781 Pleurodynia: Secondary | ICD-10-CM | POA: Diagnosis not present

## 2019-03-15 NOTE — Assessment & Plan Note (Signed)
Pt with pleuritic chest pain following likely viral illness in early January... possible muscle strain, minor rib fracture, viral pleurisy... much less likely pneumonia given pt feeling well, no fever, no cough.  Offered visit to respiratory clinic tonight for lung exam and CXR... pt not interested at this time.  Will try heat, gentle stretching and ibuprofen, but if worsening over weekend low threshold for in person exam.  If not improved by Monday.. pt will call for appt in respiratory clinic on Monday night.

## 2019-03-15 NOTE — Telephone Encounter (Signed)
Noted  

## 2019-03-15 NOTE — Progress Notes (Signed)
VIRTUAL VISIT Due to national recommendations of social distancing due to COVID 19, a virtual visit is felt to be most appropriate for this patient at this time.   I connected with the patient on 03/15/19 at  9:20 AM EST by virtual telehealth platform and verified that I am speaking with the correct person using two identifiers.   I discussed the limitations, risks, security and privacy concerns of performing an evaluation and management service by  virtual telehealth platform and the availability of in person appointments. I also discussed with the patient that there may be a patient responsible charge related to this service. The patient expressed understanding and agreed to proceed.  Patient location: Home Provider Location: [facility] Participants: Kerby Nora and [name]   Chief Complaint  Patient presents with  . Chest Pain    Chest tightness and upper left back-Tested negative for Covid at [facility] early January  . Shortness of Breath    on exertion    History of Present Illness:   39 year old female patient of Graylon Gunning with history of  Allergies andmigraine presents with  new onset chest tightness and shortness of breath.   She reports  In beginning of January she had chest tightness and dry cough, deep, achyiness She had a negative rapid COVID test at [facility] in early January.  Lungs were clear, pulse ox normal. Since then she started to improve., never went away.  Cough resolved, body resolved, continued with mild chest tightness.    Now in last week... increasing left mid back at bra strap line... sore area when she takes a deep breath. Chest also in center when moving around she feels more breathless.  No fever.  Feels fine otherwise.  She works at a school. She is on singulair and has added occ ibuprofen for her symptoms. Nonsmoker, o COPD, no ashtma history.   Hx of pleuritis and PNA in past.  COVID 19 screen No recent travel or known  exposure to COVID19 The patient denies respiratory symptoms of COVID 19 at this time.  The importance of social distancing was discussed today.   Review of Systems  Constitutional: Negative for chills and fever.  HENT: Negative for congestion and ear pain.   Eyes: Negative for pain and redness.  Respiratory: Positive for shortness of breath. Negative for cough, hemoptysis, sputum production and wheezing.   Cardiovascular: Positive for chest pain. Negative for palpitations and leg swelling.  Gastrointestinal: Negative for abdominal pain, blood in stool, constipation, diarrhea, nausea and vomiting.  Genitourinary: Negative for dysuria.  Musculoskeletal: Negative for falls and myalgias.  Skin: Negative for rash.  Neurological: Negative for dizziness.  Psychiatric/Behavioral: Negative for depression. The patient is not nervous/anxious.       Past Medical History:  Diagnosis Date  . Allergy   . Constipation    Controlled with Shakeology through [employer]  . Depression    Mild    reports that she has never smoked. She has never used smokeless tobacco. She reports current alcohol use of about 2.0 standard drinks of alcohol per week. She reports that she does not use drugs.   Current Outpatient Medications:  .  buPROPion (WELLBUTRIN XL) 300 MG 24 hr tablet, TAKE 1 TABLET(300 MG) BY MOUTH DAILY, Disp: 90 tablet, Rfl: 1 .  montelukast (SINGULAIR) 10 MG tablet, TAKE 1 TABLET(10 MG) BY MOUTH AT BEDTIME, Disp: 90 tablet, Rfl: 1 .  Multiple Vitamin (MULTIVITAMIN) tablet, Take 1 tablet by  mouth daily. Reported on 06/12/2015, Disp: , Rfl:  .  SUMAtriptan (IMITREX) 25 MG tablet, Take one tablet by mouth at migraine onset, may repeat with second tablet two hours later if migraine persists., Disp: 10 tablet, Rfl: 0   Observations/Objective: Temperature (!) 97.2 F (36.2 C), temperature source Temporal, height 5' 5.75" (1.67 m).  Physical Exam  Physical Exam Constitutional:      General: The  patient is not in acute distress. Speaking in complete sentences Pulmonary:     Effort: Pulmonary effort is normal. No respiratory distress.  Neurological:     Mental Status: The patient is alert and oriented to person, place, and time.  Psychiatric:        Mood and Affect: Mood normal.        Behavior: Behavior normal.   Assessment and Plan    I discussed the assessment and treatment plan with the patient. The patient was provided an opportunity to ask questions and all were answered. The patient agreed with the plan and demonstrated an understanding of the instructions.   The patient was advised to call back or seek an in-person evaluation if the symptoms worsen or if the condition fails to improve as anticipated.   Pleuritic chest pain  Pt with pleuritic chest pain following likely viral illness in early January... possible muscle strain, minor rib fracture, viral pleurisy... much less likely pneumonia given pt feeling well, no fever, no cough.  Offered visit to respiratory clinic tonight for lung exam and CXR... pt not interested at this time.  Will try heat, gentle stretching and ibuprofen, but if worsening over weekend low threshold for in person exam.  If not improved by Monday.. pt will call for appt in respiratory clinic on Monday night.    Eliezer Lofts, MD

## 2019-03-15 NOTE — Patient Instructions (Signed)
Ibuprofen 600 mg -800 mg every 8 hour.  Rest, fluids.  If not improving as expected by Monday call for respiratory clinic visit.  If over the weekend increase in shortness of breath , new fever or feeling.. be seen sooner or in ER if severe.

## 2019-03-15 NOTE — Telephone Encounter (Signed)
Pt said first of Jan pt had chest congestion with pressure; pt seen at [facility] with neg covid. Pt having lt side back pain near shoulder blade; pt also has sharpe CP on and off and pain worsens when deep breath. Dry cough on and off. No fever, dizziness, pain does not radiate anywhere. Pt said she feels good. No other covid symptoms; pt scheduled virtual appt this morning with Dr Ermalene Searing at 9:20. UC & ED precautions given and pt voiced understanding. There are appts today in respiratory clinic if needed.

## 2019-03-25 ENCOUNTER — Other Ambulatory Visit: Payer: Self-pay | Admitting: Primary Care

## 2019-03-25 DIAGNOSIS — J302 Other seasonal allergic rhinitis: Secondary | ICD-10-CM

## 2019-04-03 MED ORDER — BUPROPION HCL ER (XL) 150 MG PO TB24: 150.0000 mg | ORAL_TABLET | Freq: Every day | ORAL | 0 refills | Status: DC

## 2019-04-06 ENCOUNTER — Encounter (INDEPENDENT_AMBULATORY_CARE_PROVIDER_SITE_OTHER): Payer: Self-pay

## 2019-05-28 ENCOUNTER — Other Ambulatory Visit: Payer: Self-pay

## 2019-05-28 DIAGNOSIS — G43909 Migraine, unspecified, not intractable, without status migrainosus: Secondary | ICD-10-CM

## 2019-05-29 MED ORDER — SUMATRIPTAN SUCCINATE 25 MG PO TABS: ORAL_TABLET | ORAL | 0 refills | Status: DC

## 2019-05-29 NOTE — Telephone Encounter (Signed)
Last prescribed on 12/10/2018 . Last appointment on 03/15/2019 with Dr Ermalene Searing. No future appointment

## 2019-05-29 NOTE — Telephone Encounter (Signed)
Noted, refill sent to pharmacy. 

## 2019-07-08 DIAGNOSIS — J302 Other seasonal allergic rhinitis: Secondary | ICD-10-CM

## 2019-07-08 MED ORDER — MONTELUKAST SODIUM 10 MG PO TABS: ORAL_TABLET | ORAL | 1 refills | Status: DC

## 2019-08-12 ENCOUNTER — Encounter: Payer: BC Managed Care – PPO | Admitting: Primary Care

## 2019-08-20 ENCOUNTER — Other Ambulatory Visit: Payer: Self-pay

## 2019-08-20 ENCOUNTER — Ambulatory Visit (INDEPENDENT_AMBULATORY_CARE_PROVIDER_SITE_OTHER): Payer: BC Managed Care – PPO | Admitting: Primary Care

## 2019-08-20 ENCOUNTER — Encounter: Payer: Self-pay | Admitting: Primary Care

## 2019-08-20 VITALS — BP 116/76 | HR 82 | Temp 95.7°F | Ht 65.75 in | Wt 177.8 lb

## 2019-08-20 DIAGNOSIS — R519 Headache, unspecified: Secondary | ICD-10-CM

## 2019-08-20 DIAGNOSIS — F419 Anxiety disorder, unspecified: Secondary | ICD-10-CM

## 2019-08-20 DIAGNOSIS — Z23 Encounter for immunization: Secondary | ICD-10-CM

## 2019-08-20 DIAGNOSIS — Z0000 Encounter for general adult medical examination without abnormal findings: Secondary | ICD-10-CM

## 2019-08-20 DIAGNOSIS — F32A Depression, unspecified: Secondary | ICD-10-CM

## 2019-08-20 DIAGNOSIS — G43909 Migraine, unspecified, not intractable, without status migrainosus: Secondary | ICD-10-CM

## 2019-08-20 DIAGNOSIS — J302 Other seasonal allergic rhinitis: Secondary | ICD-10-CM

## 2019-08-20 DIAGNOSIS — Z1231 Encounter for screening mammogram for malignant neoplasm of breast: Secondary | ICD-10-CM

## 2019-08-20 DIAGNOSIS — N289 Disorder of kidney and ureter, unspecified: Secondary | ICD-10-CM

## 2019-08-20 DIAGNOSIS — F329 Major depressive disorder, single episode, unspecified: Secondary | ICD-10-CM

## 2019-08-20 LAB — LIPID PANEL
Cholesterol: 164 mg/dL (ref 0–200)
HDL: 70 mg/dL (ref >39.00)
LDL Cholesterol: 83 mg/dL (ref 0–99)
NonHDL: 94.28
Total CHOL/HDL Ratio: 2
Triglycerides: 56 mg/dL (ref 0.0–149.0)
VLDL: 11.2 mg/dL (ref 0.0–40.0)

## 2019-08-20 LAB — CBC
HCT: 40.5 % (ref 36.0–46.0)
Hemoglobin: 13.8 g/dL (ref 12.0–15.0)
MCHC: 34.2 g/dL (ref 30.0–36.0)
MCV: 88.2 fl (ref 78.0–100.0)
Platelets: 303 10*3/uL (ref 150.0–400.0)
RBC: 4.59 Mil/uL (ref 3.87–5.11)
RDW: 12.9 % (ref 11.5–15.5)
WBC: 7.5 10*3/uL (ref 4.0–10.5)

## 2019-08-20 LAB — COMPREHENSIVE METABOLIC PANEL
ALT: 17 U/L (ref 0–35)
AST: 28 U/L (ref 0–37)
Albumin: 4.6 g/dL (ref 3.5–5.2)
Alkaline Phosphatase: 52 U/L (ref 39–117)
BUN: 13 mg/dL (ref 6–23)
CO2: 28 mEq/L (ref 19–32)
Calcium: 9.7 mg/dL (ref 8.4–10.5)
Chloride: 104 mEq/L (ref 96–112)
Creatinine, Ser: 1.03 mg/dL (ref 0.40–1.20)
GFR: 59.34 mL/min — ABNORMAL LOW (ref >60.00)
Glucose, Bld: 93 mg/dL (ref 70–99)
Potassium: 4.4 mEq/L (ref 3.5–5.1)
Sodium: 139 mEq/L (ref 135–145)
Total Bilirubin: 0.7 mg/dL (ref 0.2–1.2)
Total Protein: 6.8 g/dL (ref 6.0–8.3)

## 2019-08-20 MED ORDER — BUPROPION HCL 75 MG PO TABS: 75.0000 mg | ORAL_TABLET | Freq: Every day | ORAL | 0 refills | Status: DC

## 2019-08-20 MED ORDER — RIZATRIPTAN BENZOATE 5 MG PO TABS: ORAL_TABLET | ORAL | 0 refills | Status: DC

## 2019-08-20 NOTE — Patient Instructions (Signed)
Stop by the lab prior to leaving today. I will notify you of your results once received.   Continue exercising. You should be getting 150 minutes of moderate intensity exercise weekly.  Continue to work on a healthy diet. Ensure you are consuming 64 ounces of water daily.  Call the breast center to schedule your mammogram.   Update me regarding the rizatriptan (Maxalt) medication for migraines.   It was a pleasure to see you today!   Preventive Care 63-61 Years Old, Female Preventive care refers to visits with your health care provider and lifestyle choices that can promote health and wellness. This includes:  A yearly physical exam. This may also be called an annual well check.  Regular dental visits and eye exams.  Immunizations.  Screening for certain conditions.  Healthy lifestyle choices, such as eating a healthy diet, getting regular exercise, not using drugs or products that contain nicotine and tobacco, and limiting alcohol use. What can I expect for my preventive care visit? Physical exam Your health care provider will check your:  Height and weight. This may be used to calculate body mass index (BMI), which tells if you are at a healthy weight.  Heart rate and blood pressure.  Skin for abnormal spots. Counseling Your health care provider may ask you questions about your:  Alcohol, tobacco, and drug use.  Emotional well-being.  Home and relationship well-being.  Sexual activity.  Eating habits.  Work and work Statistician.  Method of birth control.  Menstrual cycle.  Pregnancy history. What immunizations do I need?  Influenza (flu) vaccine  This is recommended every year. Tetanus, diphtheria, and pertussis (Tdap) vaccine  You may need a Td booster every 10 years. Varicella (chickenpox) vaccine  You may need this if you have not been vaccinated. Zoster (shingles) vaccine  You may need this after age 69. Measles, mumps, and rubella (MMR)  vaccine  You may need at least one dose of MMR if you were born in 1957 or later. You may also need a second dose. Pneumococcal conjugate (PCV13) vaccine  You may need this if you have certain conditions and were not previously vaccinated. Pneumococcal polysaccharide (PPSV23) vaccine  You may need one or two doses if you smoke cigarettes or if you have certain conditions. Meningococcal conjugate (MenACWY) vaccine  You may need this if you have certain conditions. Hepatitis A vaccine  You may need this if you have certain conditions or if you travel or work in places where you may be exposed to hepatitis A. Hepatitis B vaccine  You may need this if you have certain conditions or if you travel or work in places where you may be exposed to hepatitis B. Haemophilus influenzae type b (Hib) vaccine  You may need this if you have certain conditions. Human papillomavirus (HPV) vaccine  If recommended by your health care provider, you may need three doses over 6 months. You may receive vaccines as individual doses or as more than one vaccine together in one shot (combination vaccines). Talk with your health care provider about the risks and benefits of combination vaccines. What tests do I need? Blood tests  Lipid and cholesterol levels. These may be checked every 5 years, or more frequently if you are over 64 years old.  Hepatitis C test.  Hepatitis B test. Screening  Lung cancer screening. You may have this screening every year starting at age 20 if you have a 30-pack-year history of smoking and currently smoke or have quit within  the past 15 years.  Colorectal cancer screening. All adults should have this screening starting at age 26 and continuing until age 35. Your health care provider may recommend screening at age 50 if you are at increased risk. You will have tests every 1-10 years, depending on your results and the type of screening test.  Diabetes screening. This is done by  checking your blood sugar (glucose) after you have not eaten for a while (fasting). You may have this done every 1-3 years.  Mammogram. This may be done every 1-2 years. Talk with your health care provider about when you should start having regular mammograms. This may depend on whether you have a family history of breast cancer.  BRCA-related cancer screening. This may be done if you have a family history of breast, ovarian, tubal, or peritoneal cancers.  Pelvic exam and Pap test. This may be done every 3 years starting at age 6. Starting at age 61, this may be done every 5 years if you have a Pap test in combination with an HPV test. Other tests  Sexually transmitted disease (STD) testing.  Bone density scan. This is done to screen for osteoporosis. You may have this scan if you are at high risk for osteoporosis. Follow these instructions at home: Eating and drinking  Eat a diet that includes fresh fruits and vegetables, whole grains, lean protein, and low-fat dairy.  Take vitamin and mineral supplements as recommended by your health care provider.  Do not drink alcohol if: ? Your health care provider tells you not to drink. ? You are pregnant, may be pregnant, or are planning to become pregnant.  If you drink alcohol: ? Limit how much you have to 0-1 drink a day. ? Be aware of how much alcohol is in your drink. In the [place] one drink equals one 12 oz bottle of beer (355 mL), one 5 oz glass of wine (148 mL), or one 1 oz glass of hard liquor (44 mL). Lifestyle  Take daily care of your teeth and gums.  Stay active. Exercise for at least 30 minutes on 5 or more days each week.  Do not use any products that contain nicotine or tobacco, such as cigarettes, e-cigarettes, and chewing tobacco. If you need help quitting, ask your health care provider.  If you are sexually active, practice safe sex. Use a condom or other form of birth control (contraception) in order to prevent pregnancy  and STIs (sexually transmitted infections).  If told by your health care provider, take low-dose aspirin daily starting at age 31. What's next?  Visit your health care provider once a year for a well check visit.  Ask your health care provider how often you should have your eyes and teeth checked.  Stay up to date on all vaccines. This information is not intended to replace advice given to you by your health care provider. Make sure you discuss any questions you have with your health care provider. Document Revised: 10/12/2017 Document Reviewed: 10/12/2017 Elsevier Patient Education  2020 [employer].

## 2019-08-20 NOTE — Assessment & Plan Note (Signed)
Tetanus due, provided today. Mammogram due, order placed.  Discussed the importance of a healthy diet and regular exercise in order for weight loss, and to reduce the risk of any potential medical problems.  Exam today unremarkable. Labs pending.

## 2019-08-20 NOTE — Assessment & Plan Note (Signed)
Well controlled on Singulair, continue same.

## 2019-08-20 NOTE — Progress Notes (Signed)
Subjective:    Patient ID: Meghan Jones, female    DOB: 31-Aug-1980, 39 y.o.   MRN: [id number]  HPI  This visit occurred during the SARS-CoV-2 public health emergency.  Safety protocols were in place, including screening questions prior to the visit, additional usage of staff PPE, and extensive cleaning of exam room while observing appropriate contact time as indicated for disinfecting solutions.   Meghan Jones is a 39 year old female who presents today for complete physical.  She would also like to discuss a reduction in her Wellbutrin. Overall has done well on the 150 mg dose and would like to continue a gradual wean.   Immunizations: -Tetanus: Completed in 2011 -Influenza: Completed last season -Covid-19: Completed series  Diet: She endorses a fair diet Exercise: She is exercising most days of the week.  Eye exam: Completed in 2020 Dental exam: Completes semi-annually   Pap Smear: Hysterectomy  Mammogram: Due   BP Readings from Last 3 Encounters:  08/20/19 116/76  12/10/18 128/82  04/03/18 120/70     Review of Systems  Constitutional: Negative for unexpected weight change.  HENT: Negative for rhinorrhea.   Respiratory: Negative for cough and shortness of breath.   Cardiovascular: Negative for chest pain.  Gastrointestinal: Negative for constipation and diarrhea.  Genitourinary: Negative for difficulty urinating.  Musculoskeletal: Negative for arthralgias and myalgias.  Skin: Negative for rash.  Allergic/Immunologic: Negative for environmental allergies.  Neurological: Negative for dizziness, numbness and headaches.  Psychiatric/Behavioral: The patient is not nervous/anxious.        Past Medical History:  Diagnosis Date  . Allergy   . Constipation    Controlled with Shakeology through [employer]  . Depression    Mild     Social History   Socioeconomic History  . Marital status: Married    Spouse name: [name]  . Number of children: 1  . Years of  education: 79  . Highest education level: Not on file  Occupational History  . Occupation: Mining engineer: [employer]    Comment: Designer, multimedia  Tobacco Use  . Smoking status: Never Smoker  . Smokeless tobacco: Never Used  Substance and Sexual Activity  . Alcohol use: Yes    Alcohol/week: 2.0 standard drinks    Types: 1 Glasses of wine, 1 Shots of liquor per week    Comment: Occassionally 2 drinks weekly  . Drug use: No  . Sexual activity: Yes    Birth control/protection: Surgical  Other Topics Concern  . Not on file  Social History Narrative   Samyiah grew up in [place], [place].    She attended [address] [employer] [place] in [place] ([employer]) and obtained her bachelors in Psychologist, sport and exercise.    She then attended [employer] and obtained her Masters in [employer].    She lives at home with her husband [name]) and daughter [name]).    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     Social Determinants of Health   Financial Resource Strain:   . Difficulty of Paying Living Expenses:   Food Insecurity:   . Worried About Programme researcher, broadcasting/film/video in the Last Year:   . Barista in the Last Year:   Transportation Needs:   . Freight forwarder (Medical):   Marland Kitchen Lack of Transportation (Non-Medical):   Physical Activity:   . Days of Exercise per Week:   . Minutes of Exercise per Session:   Stress:   .  Feeling of Stress :   Social Connections:   . Frequency of Communication with Friends and Family:   . Frequency of Social Gatherings with Friends and Family:   . Attends Religious Services:   . Active Member of Clubs or Organizations:   . Attends Banker Meetings:   Marland Kitchen Marital Status:   Intimate Partner Violence:   . Fear of Current or Ex-Partner:   . Emotionally Abused:   Marland Kitchen Physically Abused:   . Sexually Abused:     Past Surgical History:  Procedure Laterality Date  . ABDOMINAL HYSTERECTOMY  2011   Asherman's  Syndrome  . APPENDECTOMY  2000    Family History  Problem Relation Age of Onset  . Hyperlipidemia Maternal Grandfather   . Hyperlipidemia Paternal Grandmother   . Heart disease Paternal Grandmother 49       CAD - Died MI  . Mental illness Paternal Grandmother   . Hypertension Paternal Grandmother   . Depression Paternal Grandmother        Suicide attempt x 3  . Obesity Paternal Grandmother   . Diabetes Paternal Grandmother   . Hyperlipidemia Paternal Grandfather   . Obesity Paternal Grandfather   . Hypertension Paternal Grandfather   . Diabetes Paternal Grandfather   . Depression Sister     No Known Allergies  Current Outpatient Medications on File Prior to Visit  Medication Sig Dispense Refill  . montelukast (SINGULAIR) 10 MG tablet TAKE 1 TABLET(10 MG) BY MOUTH AT BEDTIME 90 tablet 1  . Multiple Vitamin (MULTIVITAMIN) tablet Take 1 tablet by mouth daily. Reported on 06/12/2015    . SUMAtriptan (IMITREX) 25 MG tablet Take one tablet by mouth at migraine onset, may repeat with second tablet two hours later if migraine persists. 10 tablet 0  . [DISCONTINUED] buPROPion (WELLBUTRIN XL) 150 MG 24 hr tablet Take 1 tablet (150 mg total) by mouth daily. 90 tablet 0   No current facility-administered medications on file prior to visit.    BP 116/76   Pulse 82   Temp (!) 95.7 F (35.4 C) (Temporal)   Ht 5' 5.75" (1.67 m)   Wt 177 lb 12 oz (80.6 kg)   SpO2 98%   BMI 28.91 kg/m    Objective:   Physical Exam HENT:     Right Ear: Tympanic membrane and ear canal normal.     Left Ear: Tympanic membrane and ear canal normal.  Eyes:     Pupils: Pupils are equal, round, and reactive to light.  Cardiovascular:     Rate and Rhythm: Normal rate and regular rhythm.  Pulmonary:     Effort: Pulmonary effort is normal.     Breath sounds: Normal breath sounds.  Abdominal:     General: Bowel sounds are normal.     Palpations: Abdomen is soft.     Tenderness: There is no abdominal  tenderness.  Musculoskeletal:        General: Normal range of motion.     Cervical back: Neck supple.  Skin:    General: Skin is warm and dry.  Neurological:     Mental Status: She is alert and oriented to person, place, and time.     Cranial Nerves: No cranial nerve deficit.     Deep Tendon Reflexes:     Reflex Scores:      Patellar reflexes are 2+ on the right side and 2+ on the left side. Psychiatric:        Mood and Affect: Mood  normal.            Assessment & Plan:

## 2019-08-20 NOTE — Assessment & Plan Note (Signed)
Migraines occurring once monthly, overall tolerable.  Trial of Maxalt as Imitrex isn't effective. She will update.

## 2019-08-20 NOTE — Assessment & Plan Note (Signed)
Repeat renal function pending. 

## 2019-08-20 NOTE — Assessment & Plan Note (Signed)
Doing very well, would like to trial a continued wean from Wellbutrin. Reduce dose to 75 mg daily, she will update.

## 2019-08-20 NOTE — Addendum Note (Signed)
Addended by: Tawnya Crook on: 08/20/2019 11:21 AM   Modules accepted: Orders

## 2019-08-20 NOTE — Assessment & Plan Note (Signed)
Overall improved, occurring once monthly but without significant relief with Imitrex. Will trial Maxalt, she will update.

## 2019-09-10 ENCOUNTER — Ambulatory Visit
Admission: RE | Admit: 2019-09-10 | Discharge: 2019-09-10 | Disposition: A | Discharge status: Home / Self Care | Payer: BC Managed Care – PPO | Source: Ambulatory Visit | Attending: Primary Care | Admitting: Primary Care

## 2019-09-10 DIAGNOSIS — Z1231 Encounter for screening mammogram for malignant neoplasm of breast: Secondary | ICD-10-CM

## 2019-11-04 ENCOUNTER — Other Ambulatory Visit: Payer: Self-pay | Admitting: Primary Care

## 2019-11-04 DIAGNOSIS — G43909 Migraine, unspecified, not intractable, without status migrainosus: Secondary | ICD-10-CM

## 2019-11-12 NOTE — Telephone Encounter (Signed)
I sent you several messages about adding on for 11/13/19 so I don't know if we have any more availability.  I believe the 3:20 slot is pending for add on? If not then we can add her then. If so then Thursday or with another provider for Wednesday,

## 2019-11-13 NOTE — Telephone Encounter (Signed)
Spoke with pt.She is feeling better.

## 2019-11-13 NOTE — Telephone Encounter (Signed)
Noted and glad to hear! 

## 2019-11-21 ENCOUNTER — Other Ambulatory Visit: Payer: Self-pay | Admitting: Primary Care

## 2019-11-21 DIAGNOSIS — F32A Depression, unspecified: Secondary | ICD-10-CM

## 2019-11-21 DIAGNOSIS — F419 Anxiety disorder, unspecified: Secondary | ICD-10-CM

## 2020-02-28 DIAGNOSIS — F32A Depression, unspecified: Secondary | ICD-10-CM

## 2020-02-28 DIAGNOSIS — F419 Anxiety disorder, unspecified: Secondary | ICD-10-CM

## 2020-03-03 MED ORDER — BUPROPION HCL ER (XL) 150 MG PO TB24: 150.0000 mg | ORAL_TABLET | Freq: Every day | ORAL | 1 refills | Status: DC

## 2020-04-24 ENCOUNTER — Other Ambulatory Visit: Payer: Self-pay

## 2020-04-24 DIAGNOSIS — J302 Other seasonal allergic rhinitis: Secondary | ICD-10-CM

## 2020-04-24 MED ORDER — MONTELUKAST SODIUM 10 MG PO TABS: ORAL_TABLET | ORAL | 1 refills | Status: DC

## 2020-05-14 ENCOUNTER — Telehealth: Payer: Self-pay

## 2020-05-14 ENCOUNTER — Ambulatory Visit: Payer: BC Managed Care – PPO | Admitting: Family Medicine

## 2020-05-14 NOTE — Telephone Encounter (Signed)
[facility] Night - Client Nonclinical Telephone Record AccessNurse Client [facility] Night - Client Client Site  [facility] Night Physician Gweneth Dimitri- MD Contact Type Call Who Is Calling Patient / Member / Family / Caregiver Caller Name Jullisa Grigoryan Caller Phone Number [phone] Patient Name Makeisha Jentsch Patient DOB 01/16/1981 Call Type Message Only Information Provided Reason for Call Request to [facility] Appointment Initial Comment Caller would like to cancel an appointment for tomorrow morning at 9 am. Additional Comment Declined triage. Provided office hours. Disp. Time Disposition Final User 05/13/2020 8:51:07 PM General Information Provided Yes Raechel Ache, Crystal Call Closed By: Felizardo Hoffmann Transaction Date/Time: 05/13/2020 8:48:46 PM (ET)

## 2020-05-14 NOTE — Telephone Encounter (Signed)
Per appt notes appt has already been cancelled by [facility].

## 2020-05-22 ENCOUNTER — Ambulatory Visit: Payer: BC Managed Care – PPO | Admitting: Family Medicine

## 2020-05-22 ENCOUNTER — Other Ambulatory Visit: Payer: Self-pay

## 2020-05-22 ENCOUNTER — Encounter: Payer: Self-pay | Admitting: Family Medicine

## 2020-05-22 VITALS — BP 126/72 | HR 70 | Temp 97.0°F | Ht 65.75 in | Wt 181.4 lb

## 2020-05-22 DIAGNOSIS — F419 Anxiety disorder, unspecified: Secondary | ICD-10-CM | POA: Diagnosis not present

## 2020-05-22 DIAGNOSIS — F32A Depression, unspecified: Secondary | ICD-10-CM | POA: Diagnosis not present

## 2020-05-22 DIAGNOSIS — R5382 Chronic fatigue, unspecified: Secondary | ICD-10-CM

## 2020-05-22 DIAGNOSIS — H9202 Otalgia, left ear: Secondary | ICD-10-CM | POA: Diagnosis not present

## 2020-05-22 DIAGNOSIS — R5383 Other fatigue: Secondary | ICD-10-CM | POA: Insufficient documentation

## 2020-05-22 HISTORY — DX: Otalgia, left ear: H92.02

## 2020-05-22 MED ORDER — FLUTICASONE PROPIONATE 50 MCG/ACT NA SUSP: 2.0000 | Freq: Every day | NASAL | 6 refills | Status: DC

## 2020-05-22 NOTE — Progress Notes (Signed)
Subjective:    Patient ID: Meghan Jones, female    DOB: 1980/12/03, 40 y.o.   MRN: [id number]  This visit occurred during the SARS-CoV-2 public health emergency.  Safety protocols were in place, including screening questions prior to the visit, additional usage of staff PPE, and extensive cleaning of exam room while observing appropriate contact time as indicated for disinfecting solutions.    HPI 40 yo pt of NP Clark presents with ear pain and some fatigue   Wt Readings from Last 3 Encounters:  05/22/20 181 lb 6 oz (82.3 kg)  08/20/19 177 lb 12 oz (80.6 kg)  12/10/18 174 lb 4 oz (79 kg)   29.50 kg/m  Has some TMJ issues- discussed with her dentist Used to grind teeth  She sleeps with hands under L side   Has R sided hearing loss for 8 years when she had high fever Sensory -neural  Never found other cause   Having sharp pains in ear (Left) Episodic Lasts for a few minutes  Aching also -that lasts hours  No hearing loss in that ear but does have some tinnitus (worse lately)  A little tender pressing on cartilage in ears   Facial pain- just a little over angle of mandible  No cracked teeth   Having a lot more headaches/migraines lately   Allergic rhinitis - is controlled well with singulair  Just a little worse lately  Has never had ETD that she knows of  Does not use nasal sprays  No fever No malaise  A little dizziness from time to time    Also very fatigued Abnormally tired Just wants to sleep  To tired to do things she wants to do  Hair is thinning  Nails are peeling  Difficult to loose weight  No menses-hysterectomy -partial 2011   Work is crazy- at a school   Patient Active Problem List   Diagnosis Date Noted  . Acute otalgia, left 05/22/2020  . Fatigue 05/22/2020  . Pleuritic chest pain 03/15/2019  . Migraine 12/10/2018  . Decreased renal function 04/03/2018  . Frequent headaches 04/03/2018  . Seasonal allergic rhinitis 05/10/2017  .  Preventative health care 02/06/2013  . Anxiety and depression 02/06/2013   Past Medical History:  Diagnosis Date  . Allergy   . Constipation    Controlled with Shakeology through [employer]  . Depression    Mild   Past Surgical History:  Procedure Laterality Date  . ABDOMINAL HYSTERECTOMY  2011   Asherman's Syndrome  . APPENDECTOMY  2000   Social History   Tobacco Use  . Smoking status: Never Smoker  . Smokeless tobacco: Never Used  Substance Use Topics  . Alcohol use: Yes    Alcohol/week: 2.0 standard drinks    Types: 1 Glasses of wine, 1 Shots of liquor per week    Comment: Occassionally 2 drinks weekly  . Drug use: No   Family History  Problem Relation Age of Onset  . Hyperlipidemia Maternal Grandfather   . Hyperlipidemia Paternal Grandmother   . Heart disease Paternal Grandmother 50       CAD - Died MI  . Mental illness Paternal Grandmother   . Hypertension Paternal Grandmother   . Depression Paternal Grandmother        Suicide attempt x 3  . Obesity Paternal Grandmother   . Diabetes Paternal Grandmother   . Hyperlipidemia Paternal Grandfather   . Obesity Paternal Grandfather   . Hypertension Paternal Grandfather   . Diabetes  Paternal Grandfather   . Depression Sister   . Breast cancer Neg Hx    No Known Allergies Current Outpatient Medications on File Prior to Visit  Medication Sig Dispense Refill  . buPROPion (WELLBUTRIN XL) 150 MG 24 hr tablet Take 1 tablet (150 mg total) by mouth daily. For depression and anxiety. 90 tablet 1  . montelukast (SINGULAIR) 10 MG tablet TAKE 1 TABLET(10 MG) BY MOUTH AT BEDTIME 90 tablet 1  . Multiple Vitamin (MULTIVITAMIN) tablet Take 1 tablet by mouth daily. Reported on 06/12/2015    . rizatriptan (MAXALT) 5 MG tablet TAKE 1 TABLET BY MOUTH AT MIGRAINE ONSET, MAY REPEAT IN 2 HOURS IF NEEDED. 10 tablet 0   No current facility-administered medications on file prior to visit.    Review of Systems  Constitutional: Positive  for fatigue. Negative for activity change, appetite change, fever and unexpected weight change.  HENT: Positive for congestion. Negative for ear pain, rhinorrhea, sinus pressure and sore throat.   Eyes: Negative for pain, redness and visual disturbance.  Respiratory: Negative for cough, shortness of breath and wheezing.   Cardiovascular: Negative for chest pain and palpitations.  Gastrointestinal: Negative for abdominal pain, blood in stool, constipation and diarrhea.  Endocrine: Negative for polydipsia and polyuria.  Genitourinary: Negative for dysuria, frequency and urgency.  Musculoskeletal: Negative for arthralgias, back pain and myalgias.  Skin: Negative for pallor and rash.  Allergic/Immunologic: Negative for environmental allergies.  Neurological: Negative for dizziness, syncope and headaches.  Hematological: Negative for adenopathy. Does not bruise/bleed easily.  Psychiatric/Behavioral: Negative for decreased concentration, dysphoric mood and sleep disturbance. The patient is not nervous/anxious.        Stressors   Feels like she can sleep all the time       Objective:   Physical Exam Constitutional:      General: She is not in acute distress.    Appearance: Normal appearance. She is well-developed.     Comments: overwt  HENT:     Head: Normocephalic and atraumatic.     Comments: Some mildly tender TMJ bilaterally  No temporal tenderness     Right Ear: Tympanic membrane, ear canal and external ear normal. There is no impacted cerumen.     Left Ear: External ear normal.     Ears:     Comments: L TM is more dull and slightly retracted when compared to the right    Nose: Congestion present.     Mouth/Throat:     Mouth: Mucous membranes are moist.     Pharynx: No posterior oropharyngeal erythema.  Eyes:     General: No scleral icterus.    Conjunctiva/sclera: Conjunctivae normal.     Pupils: Pupils are equal, round, and reactive to light.  Neck:     Thyroid: No  thyromegaly.     Vascular: No carotid bruit or JVD.  Cardiovascular:     Rate and Rhythm: Normal rate and regular rhythm.     Heart sounds: Normal heart sounds. No gallop.   Pulmonary:     Effort: Pulmonary effort is normal. No respiratory distress.     Breath sounds: Normal breath sounds. No wheezing or rales.  Abdominal:     General: Bowel sounds are normal. There is no distension or abdominal bruit.     Palpations: Abdomen is soft. There is no mass.     Tenderness: There is no abdominal tenderness.  Musculoskeletal:     Cervical back: Normal range of motion and neck supple.  Right lower leg: No edema.     Left lower leg: No edema.  Lymphadenopathy:     Cervical: No cervical adenopathy.  Skin:    General: Skin is warm and dry.     Coloration: Skin is not pale.     Findings: No erythema or rash.  Neurological:     Mental Status: She is alert.     Cranial Nerves: No cranial nerve deficit.     Sensory: No sensory deficit.     Motor: No weakness.     Coordination: Coordination normal.     Gait: Gait normal.     Deep Tendon Reflexes: Reflexes are normal and symmetric. Reflexes normal.  Psychiatric:        Mood and Affect: Mood normal.     Comments: Pleasant and talkative           Assessment & Plan:   Problem List Items Addressed This Visit      Nervous and Auditory   Acute otalgia, left - Primary    Retracted TM on exam indicates ETD-this may be the cause in the setting of allergies  Also more headaches lately and some signs of TMJ (she has d/w dentist)  Recommend flonase ns bid for 3-4 d then daily  Consider prednisone if not helpful  inst to call if worse or change  Consider bite guard if needed  Watch for more headaches or s/s of trigeminal neuralgia or any hearing loss        Other   Fatigue    New generalized fatigue w/o malaise  Some more stress  Mood is stable however  C/o nail and skin changes  H/o partial hysterectomy  Labs done today and can  f/u with pcp      Relevant Orders   CBC with Differential/Platelet (Completed)   Comprehensive metabolic panel (Completed)   Iron (Completed)   T4, free (Completed)   TSH (Completed)   Vitamin B12 (Completed)   VITAMIN D 25 Hydroxy (Vit-D Deficiency, Fractures) (Completed)

## 2020-05-22 NOTE — Patient Instructions (Addendum)
You may have some eustachian tube dysfunction  Use flonase 2 sprays in each nostil twice daily for 3 days then daily through the allergy season   Be mindful of the timing between ear pain and migraines (also nsaids)  Be mindful about clenching jaw  Watch for worse ear pain, fever, cough  Also dizziness   Update if not starting to improve in a week or if worsening    Labs today for fatigue

## 2020-05-23 ENCOUNTER — Telehealth: Payer: Self-pay

## 2020-05-23 DIAGNOSIS — G43909 Migraine, unspecified, not intractable, without status migrainosus: Secondary | ICD-10-CM

## 2020-05-23 LAB — CBC WITH DIFFERENTIAL/PLATELET
Absolute Monocytes: 778 cells/uL (ref 200–950)
Basophils Absolute: 86 cells/uL (ref 0–200)
Basophils Relative: 0.9 %
Eosinophils Absolute: 346 cells/uL (ref 15–500)
Eosinophils Relative: 3.6 %
HCT: 42.3 % (ref 35.0–45.0)
Hemoglobin: 14.3 g/dL (ref 11.7–15.5)
Lymphs Abs: 2467 cells/uL (ref 850–3900)
MCH: 29.9 pg (ref 27.0–33.0)
MCHC: 33.8 g/dL (ref 32.0–36.0)
MCV: 88.3 fL (ref 80.0–100.0)
MPV: 10.3 fL (ref 7.5–12.5)
Monocytes Relative: 8.1 %
Neutro Abs: 5923 cells/uL (ref 1500–7800)
Neutrophils Relative %: 61.7 %
Platelets: 372 10*3/uL (ref 140–400)
RBC: 4.79 10*6/uL (ref 3.80–5.10)
RDW: 12 % (ref 11.0–15.0)
Total Lymphocyte: 25.7 %
WBC: 9.6 10*3/uL (ref 3.8–10.8)

## 2020-05-23 LAB — VITAMIN B12: Vitamin B-12: 441 pg/mL (ref 200–1100)

## 2020-05-23 LAB — IRON: Iron: 126 ug/dL (ref 40–190)

## 2020-05-23 LAB — COMPREHENSIVE METABOLIC PANEL
AG Ratio: 2.2 (calc) (ref 1.0–2.5)
ALT: 12 U/L (ref 6–29)
AST: 15 U/L (ref 10–30)
Albumin: 4.6 g/dL (ref 3.6–5.1)
Alkaline phosphatase (APISO): 44 U/L (ref 31–125)
BUN: 21 mg/dL (ref 7–25)
CO2: 28 mmol/L (ref 20–32)
Calcium: 9.7 mg/dL (ref 8.6–10.2)
Chloride: 105 mmol/L (ref 98–110)
Creat: 1.02 mg/dL (ref 0.50–1.10)
Globulin: 2.1 g/dL (calc) (ref 1.9–3.7)
Glucose, Bld: 81 mg/dL (ref 65–99)
Potassium: 4.5 mmol/L (ref 3.5–5.3)
Sodium: 142 mmol/L (ref 135–146)
Total Bilirubin: 0.4 mg/dL (ref 0.2–1.2)
Total Protein: 6.7 g/dL (ref 6.1–8.1)

## 2020-05-23 LAB — TSH: TSH: 1.27 mIU/L

## 2020-05-23 LAB — VITAMIN D 25 HYDROXY (VIT D DEFICIENCY, FRACTURES): Vit D, 25-Hydroxy: 33 ng/mL (ref 30–100)

## 2020-05-23 LAB — T4, FREE: Free T4: 1.2 ng/dL (ref 0.8–1.8)

## 2020-05-24 NOTE — Assessment & Plan Note (Signed)
New generalized fatigue w/o malaise  Some more stress  Mood is stable however  C/o nail and skin changes  H/o partial hysterectomy  Labs done today and can f/u with pcp

## 2020-05-24 NOTE — Assessment & Plan Note (Signed)
Retracted TM on exam indicates ETD-this may be the cause in the setting of allergies  Also more headaches lately and some signs of TMJ (she has d/w dentist)  Recommend flonase ns bid for 3-4 d then daily  Consider prednisone if not helpful  inst to call if worse or change  Consider bite guard if needed  Watch for more headaches or s/s of trigeminal neuralgia or any hearing loss

## 2020-05-26 MED ORDER — RIZATRIPTAN BENZOATE 5 MG PO TABS: ORAL_TABLET | ORAL | 0 refills | Status: DC

## 2020-05-26 NOTE — Telephone Encounter (Signed)
Patient is due for CPE/follow up in July 2022, this will be required prior to any further refills.  Please schedule.   

## 2020-05-27 NOTE — Telephone Encounter (Signed)
Pt is scheduled for physical on 7/19

## 2020-06-06 ENCOUNTER — Telehealth: Payer: BC Managed Care – PPO | Admitting: Emergency Medicine

## 2020-06-06 DIAGNOSIS — L298 Other pruritus: Secondary | ICD-10-CM | POA: Diagnosis not present

## 2020-06-06 MED ORDER — TRIAMCINOLONE ACETONIDE 0.1 % EX CREA: 1.0000 "application " | TOPICAL_CREAM | Freq: Three times a day (TID) | CUTANEOUS | 0 refills | Status: DC | PRN

## 2020-06-06 NOTE — Progress Notes (Signed)
E Visit for Rash  We are sorry that you are not feeling well. Here is how we plan to help!  Based on what you shared with me it looks like you have a heat rash or a mild allergic reaction. This skin rash can cause irritation or inflammation.  Your skin may be red, swollen, dry, cracked, and itch.  The rash should go away in a few days but can last a few weeks.  If you get a rash, it's important to figure out what caused it so the irritant can be avoided in the future.  In the meantime, try to avoid heat and direct sunlight.  You may apply a cool damp washcloth to the area to help sooth irritation.  You may also try the prescribed triamcinolone 0.1% cream, a topical steroids to help with itching and inflammation.  Please seek in-person care if not improving in 3-4 days, or seek in-person evaluation sooner if symptoms worsening.   HOME CARE:   Take cool showers and avoid direct sunlight.  Apply cool compress or wet dressings.  Take a bath in an oatmeal bath.  Sprinkle content of one Aveeno packet under running faucet with comfortably warm water.  Bathe for 15-20 minutes, 1-2 times daily.  Pat dry with a towel. Do not rub the rash.  Use hydrocortisone cream.  Take an antihistamine like Benadryl for widespread rashes that itch.  The adult dose of Benadryl is 25-50 mg by mouth 4 times daily.  Caution:  This type of medication may cause sleepiness.  Do not drink alcohol, drive, or operate dangerous machinery while taking antihistamines.  Do not take these medications if you have prostate enlargement.  Read package instructions thoroughly on all medications that you take.  GET HELP RIGHT AWAY IF:   Symptoms don't go away after treatment.  Severe itching that persists.  If you rash spreads or swells.  If you rash begins to smell.  If it blisters and opens or develops a yellow-brown crust.  You develop a fever.  You have a sore throat.  You become short of breath.  MAKE SURE  YOU:  Understand these instructions. Will watch your condition. Will get help right away if you are not doing well or get worse.  Thank you for choosing an e-visit. Your e-visit answers were reviewed by a board certified advanced clinical practitioner to complete your personal care plan. Depending upon the condition, your plan could have included both over the counter or prescription medications. Please review your pharmacy choice. Be sure that the pharmacy you have chosen is open so that you can pick up your prescription now.  If there is a problem you may message your provider in MyChart to have the prescription routed to another pharmacy. Your safety is important to [place]. If you have drug allergies check your prescription carefully.  For the next 24 hours, you can use MyChart to ask questions about today's visit, request a non-urgent call back, or ask for a work or school excuse from your e-visit provider. You will get an email in the next two days asking about your experience. I hope that your e-visit has been valuable and will speed your recovery.    Approximately 5 minutes was spent documenting and reviewing patient's chart.

## 2020-06-06 NOTE — Addendum Note (Signed)
Addended by: Lurene Shadow on: 06/06/2020 03:03 PM   Modules accepted: Orders

## 2020-08-10 ENCOUNTER — Other Ambulatory Visit: Payer: Self-pay | Admitting: Primary Care

## 2020-08-10 DIAGNOSIS — Z1231 Encounter for screening mammogram for malignant neoplasm of breast: Secondary | ICD-10-CM

## 2020-09-01 ENCOUNTER — Ambulatory Visit (INDEPENDENT_AMBULATORY_CARE_PROVIDER_SITE_OTHER): Payer: BC Managed Care – PPO | Admitting: Primary Care

## 2020-09-01 ENCOUNTER — Encounter: Payer: Self-pay | Admitting: Primary Care

## 2020-09-01 ENCOUNTER — Other Ambulatory Visit: Payer: Self-pay

## 2020-09-01 VITALS — BP 120/68 | HR 87 | Temp 97.7°F | Ht 66.0 in | Wt 186.0 lb

## 2020-09-01 DIAGNOSIS — R109 Unspecified abdominal pain: Secondary | ICD-10-CM

## 2020-09-01 DIAGNOSIS — L659 Nonscarring hair loss, unspecified: Secondary | ICD-10-CM | POA: Diagnosis not present

## 2020-09-01 DIAGNOSIS — R1084 Generalized abdominal pain: Secondary | ICD-10-CM

## 2020-09-01 DIAGNOSIS — Z1159 Encounter for screening for other viral diseases: Secondary | ICD-10-CM

## 2020-09-01 DIAGNOSIS — Z114 Encounter for screening for human immunodeficiency virus [HIV]: Secondary | ICD-10-CM

## 2020-09-01 DIAGNOSIS — J302 Other seasonal allergic rhinitis: Secondary | ICD-10-CM | POA: Diagnosis not present

## 2020-09-01 DIAGNOSIS — F419 Anxiety disorder, unspecified: Secondary | ICD-10-CM

## 2020-09-01 DIAGNOSIS — R519 Headache, unspecified: Secondary | ICD-10-CM

## 2020-09-01 DIAGNOSIS — Z0001 Encounter for general adult medical examination with abnormal findings: Secondary | ICD-10-CM | POA: Diagnosis not present

## 2020-09-01 DIAGNOSIS — G43909 Migraine, unspecified, not intractable, without status migrainosus: Secondary | ICD-10-CM | POA: Diagnosis not present

## 2020-09-01 DIAGNOSIS — F32A Depression, unspecified: Secondary | ICD-10-CM

## 2020-09-01 HISTORY — DX: Unspecified abdominal pain: R10.9

## 2020-09-01 LAB — LIPID PANEL
Cholesterol: 188 mg/dL (ref 0–200)
HDL: 66.9 mg/dL (ref >39.00)
LDL Cholesterol: 110 mg/dL — ABNORMAL HIGH (ref 0–99)
NonHDL: 121.05
Total CHOL/HDL Ratio: 3
Triglycerides: 57 mg/dL (ref 0.0–149.0)
VLDL: 11.4 mg/dL (ref 0.0–40.0)

## 2020-09-01 LAB — FOLLICLE STIMULATING HORMONE: FSH: 5.7 m[IU]/mL

## 2020-09-01 LAB — VITAMIN D 25 HYDROXY (VIT D DEFICIENCY, FRACTURES): VITD: 42.86 ng/mL (ref 30.00–100.00)

## 2020-09-01 LAB — LUTEINIZING HORMONE: LH: 2.61 m[IU]/mL

## 2020-09-01 MED ORDER — MONTELUKAST SODIUM 10 MG PO TABS: ORAL_TABLET | ORAL | 3 refills | Status: DC

## 2020-09-01 MED ORDER — SERTRALINE HCL 50 MG PO TABS: 50.0000 mg | ORAL_TABLET | Freq: Every day | ORAL | 0 refills | Status: DC

## 2020-09-01 MED ORDER — BUPROPION HCL ER (XL) 150 MG PO TB24: 150.0000 mg | ORAL_TABLET | Freq: Every day | ORAL | 3 refills | Status: DC

## 2020-09-01 MED ORDER — RIZATRIPTAN BENZOATE 10 MG PO TABS: ORAL_TABLET | ORAL | 0 refills | Status: DC

## 2020-09-01 NOTE — Progress Notes (Signed)
Subjective:    Patient ID: Meghan Jones, female    DOB: 1980/08/13, 40 y.o.   MRN: [id number]  HPI  Meghan Jones is a very pleasant 40 y.o. female who presents today for complete physical. She would also like to discuss chronic anxiety, abdominal bloating, weight gain.   Chronic history of anxiety, is managed on Wellbutrin XL 150 mg which has been very effective for depression. Symptoms include feeling "scattered", increased anxiety, feeling anxious/nervous, difficulty finishing tasks, hard time remembering events/conversations, thinking worst case scenarios. She did set up sessions with a therapist online, has another appointment scheduled for tomorrow. She is doing well on Wellbutrin XL 150 mg for depression. She was once managed on Lexapro which caused her to feel tired/drowsy.   She would also like to discuss abdominal bloating for which is chronic. Foods such as fried food, ice cream, breads seem to cause symptoms. Her bloating is significant, has noticed several inch increase in abdominal girth at times. History of constipation, bowel movements occur 3-4 days weekly, sometimes more frequent. Also with hair thinning. Hysterectomy around 2012. Underwent thyroid labs three months ago, all of which were within normal range. She's questioning if symptoms are hormonal.   Immunizations: -Tetanus: 2021 -Influenza: Due this season  -Covid-19: 2 vaccines  Diet: Fair diet.  Exercise: No regular exercise.  Eye exam: Completes annually  Dental exam: Completes semi-annually   Pap Smear: Hysterectomy  Mammogram: Completed in July 2021, scheduled for July 2022   BP Readings from Last 3 Encounters:  09/01/20 120/68  05/22/20 126/72  08/20/19 116/76   Wt Readings from Last 3 Encounters:  09/01/20 186 lb (84.4 kg)  05/22/20 181 lb 6 oz (82.3 kg)  08/20/19 177 lb 12 oz (80.6 kg)      Review of Systems  Constitutional:  Negative for unexpected weight change.  HENT:  Negative for  rhinorrhea.   Respiratory:  Negative for shortness of breath.   Cardiovascular:  Negative for chest pain.  Gastrointestinal:  Positive for abdominal pain and constipation. Negative for diarrhea.       See HPI  Genitourinary:  Negative for difficulty urinating and menstrual problem.  Musculoskeletal:  Positive for arthralgias.  Skin:  Negative for rash.  Allergic/Immunologic: Negative for environmental allergies.  Neurological:  Positive for headaches. Negative for dizziness.  Psychiatric/Behavioral:  The patient is nervous/anxious.        See HPI        Past Medical History:  Diagnosis Date   Allergy    Constipation    Controlled with Shakeology through [facility]   Depression    Mild    Social History   Socioeconomic History   Marital status: Married    Spouse name: [name]   Number of children: 1   Years of education: 18   Highest education level: Not on file  Occupational History   Occupation: Mining engineer: [employer]    Comment: Designer, multimedia  Tobacco Use   Smoking status: Never   Smokeless tobacco: Never  Substance and Sexual Activity   Alcohol use: Yes    Alcohol/week: 2.0 standard drinks    Types: 1 Glasses of wine, 1 Shots of liquor per week    Comment: Occassionally 2 drinks weekly   Drug use: No   Sexual activity: Yes    Birth control/protection: Surgical  Other Topics Concern   Not on file  Social History Narrative   Meghan Jones grew up  in [place], [place].    She attended [address] [employer] [place] in [place] ([employer]) and obtained her bachelors in Psychologist, sport and exercise.    She then attended [employer] and obtained her Masters in [employer].    She lives at home with her husband [name]) and daughter Meghan Jones).    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     Social Determinants of Health   Financial Resource Strain: Not on file  Food Insecurity: Not on file  Transportation Needs: Not on file  Physical  Activity: Not on file  Stress: Not on file  Social Connections: Not on file  Intimate Partner Violence: Not on file    Past Surgical History:  Procedure Laterality Date   ABDOMINAL HYSTERECTOMY  2011   Asherman's Syndrome   APPENDECTOMY  2000    Family History  Problem Relation Age of Onset   Hyperlipidemia Maternal Grandfather    Hyperlipidemia Paternal Grandmother    Heart disease Paternal Grandmother 62       CAD - Died MI   Mental illness Paternal Grandmother    Hypertension Paternal Grandmother    Depression Paternal Grandmother        Suicide attempt x 3   Obesity Paternal Grandmother    Diabetes Paternal Grandmother    Hyperlipidemia Paternal Grandfather    Obesity Paternal Grandfather    Hypertension Paternal Grandfather    Diabetes Paternal Grandfather    Depression Sister    Breast cancer Neg Hx     No Known Allergies  Current Outpatient Medications on File Prior to Visit  Medication Sig Dispense Refill   buPROPion (WELLBUTRIN XL) 150 MG 24 hr tablet Take 1 tablet (150 mg total) by mouth daily. For depression and anxiety. 90 tablet 1   fluticasone (FLONASE) 50 MCG/ACT nasal spray Place 2 sprays into both nostrils daily. 16 g 6   montelukast (SINGULAIR) 10 MG tablet TAKE 1 TABLET(10 MG) BY MOUTH AT BEDTIME 90 tablet 1   Multiple Vitamin (MULTIVITAMIN) tablet Take 1 tablet by mouth daily. Reported on 06/12/2015     rizatriptan (MAXALT) 5 MG tablet Take 1 tablet by mouth at migraine onset. May repeat with an additional tablet in 2 hours if needed. 10 tablet 0   No current facility-administered medications on file prior to visit.    BP 120/68   Pulse 87   Temp 97.7 F (36.5 C) (Temporal)   Ht 5\' 6"  (1.676 m)   Wt 186 lb (84.4 kg)   SpO2 98%   BMI 30.02 kg/m  Objective:   Physical Exam HENT:     Right Ear: Tympanic membrane and ear canal normal.     Left Ear: Tympanic membrane and ear canal normal.     Nose: Nose normal.  Eyes:     Conjunctiva/sclera:  Conjunctivae normal.     Pupils: Pupils are equal, round, and reactive to light.  Neck:     Thyroid: No thyromegaly.  Cardiovascular:     Rate and Rhythm: Normal rate and regular rhythm.     Heart sounds: No murmur heard. Pulmonary:     Effort: Pulmonary effort is normal.     Breath sounds: Normal breath sounds. No rales.  Abdominal:     General: Bowel sounds are normal.     Palpations: Abdomen is soft.     Tenderness: There is no abdominal tenderness.  Musculoskeletal:        General: Normal range of motion.     Cervical back: Neck supple.  Lymphadenopathy:  Cervical: No cervical adenopathy.  Skin:    General: Skin is warm and dry.     Findings: No rash.  Neurological:     Mental Status: She is alert and oriented to person, place, and time.     Cranial Nerves: No cranial nerve deficit.     Deep Tendon Reflexes: Reflexes are normal and symmetric.  Psychiatric:        Mood and Affect: Mood normal.          Assessment & Plan:      This visit occurred during the SARS-CoV-2 public health emergency.  Safety protocols were in place, including screening questions prior to the visit, additional usage of staff PPE, and extensive cleaning of exam room while observing appropriate contact time as indicated for disinfecting solutions.

## 2020-09-01 NOTE — Assessment & Plan Note (Signed)
Deteriorated.  Discussed options which include medication and therapy, she would like to try both.  She's done very well with Wellbutrin XL 150 mg, continue same. Will add in Zoloft 50 mg, start with 25 mg for a few days.   She will update.

## 2020-09-01 NOTE — Assessment & Plan Note (Signed)
Chronic for years with bloating, also with constipation. Likely IBS. Patient would like to see GI for evaluation. Referral placed.

## 2020-09-01 NOTE — Patient Instructions (Signed)
Stop by the lab prior to leaving today. I will notify you of your results once received.   You will be contacted regarding your referral to GI for your abdominal symptoms.  Please let us know if you have not been contacted within two weeks.   Start sertraline (Zoloft) 50 mg for anxiety and depression. Take 1/2 tablet by mouth once daily for about one week, then increase to 1 full tablet thereafter.   Please contact me if you experience any problems when starting this medication or with the dose increase to 1 full tablet. Common side effects should abate within 1-2 weeks.   It was a pleasure to see you today!  Preventive Care 41-27 Years Old, Female Preventive care refers to lifestyle choices and visits with your health care provider that can promote health and wellness. This includes: A yearly physical exam. This is also called an annual wellness visit. Regular dental and eye exams. Immunizations. Screening for certain conditions. Healthy lifestyle choices, such as: Eating a healthy diet. Getting regular exercise. Not using drugs or products that contain nicotine and tobacco. Limiting alcohol use. What can I expect for my preventive care visit? Physical exam Your health care provider will check your: Height and weight. These may be used to calculate your BMI (body mass index). BMI is a measurement that tells if you are at a healthy weight. Heart rate and blood pressure. Body temperature. Skin for abnormal spots. Counseling Your health care provider may ask you questions about your: Past medical problems. Family's medical history. Alcohol, tobacco, and drug use. Emotional well-being. Home life and relationship well-being. Sexual activity. Diet, exercise, and sleep habits. Work and work Statistician. Access to firearms. Method of birth control. Menstrual cycle. Pregnancy history. What immunizations do I need?  Vaccines are usually given at various ages, according to a  schedule. Your health care provider will recommend vaccines for you based on your age, medicalhistory, and lifestyle or other factors, such as travel or where you work. What tests do I need? Blood tests Lipid and cholesterol levels. These may be checked every 5 years, or more often if you are over 53 years old. Hepatitis C test. Hepatitis B test. Screening Lung cancer screening. You may have this screening every year starting at age 81 if you have a 30-pack-year history of smoking and currently smoke or have quit within the past 15 years. Colorectal cancer screening. All adults should have this screening starting at age 74 and continuing until age 31. Your health care provider may recommend screening at age 85 if you are at increased risk. You will have tests every 1-10 years, depending on your results and the type of screening test. Diabetes screening. This is done by checking your blood sugar (glucose) after you have not eaten for a while (fasting). You may have this done every 1-3 years. Mammogram. This may be done every 1-2 years. Talk with your health care provider about when you should start having regular mammograms. This may depend on whether you have a family history of breast cancer. BRCA-related cancer screening. This may be done if you have a family history of breast, ovarian, tubal, or peritoneal cancers. Pelvic exam and Pap test. This may be done every 3 years starting at age 60. Starting at age 66, this may be done every 5 years if you have a Pap test in combination with an HPV test. Other tests STD (sexually transmitted disease) testing, if you are at risk. Bone density scan. This is  done to screen for osteoporosis. You may have this scan if you are at high risk for osteoporosis. Talk with your health care provider about your test results, treatment options,and if necessary, the need for more tests. Follow these instructions at home: Eating and drinking  Eat a diet that  includes fresh fruits and vegetables, whole grains, lean protein, and low-fat dairy products. Take vitamin and mineral supplements as recommended by your health care provider. Do not drink alcohol if: Your health care provider tells you not to drink. You are pregnant, may be pregnant, or are planning to become pregnant. If you drink alcohol: Limit how much you have to 0-1 drink a day. Be aware of how much alcohol is in your drink. In the [place] one drink equals one 12 oz bottle of beer (355 mL), one 5 oz glass of wine (148 mL), or one 1 oz glass of hard liquor (44 mL).  Lifestyle Take daily care of your teeth and gums. Brush your teeth every morning and night with fluoride toothpaste. Floss one time each day. Stay active. Exercise for at least 30 minutes 5 or more days each week. Do not use any products that contain nicotine or tobacco, such as cigarettes, e-cigarettes, and chewing tobacco. If you need help quitting, ask your health care provider. Do not use drugs. If you are sexually active, practice safe sex. Use a condom or other form of protection to prevent STIs (sexually transmitted infections). If you do not wish to become pregnant, use a form of birth control. If you plan to become pregnant, see your health care provider for a prepregnancy visit. If told by your health care provider, take low-dose aspirin daily starting at age 39. Find healthy ways to cope with stress, such as: Meditation, yoga, or listening to music. Journaling. Talking to a trusted person. Spending time with friends and family. Safety Always wear your seat belt while driving or riding in a vehicle. Do not drive: If you have been drinking alcohol. Do not ride with someone who has been drinking. When you are tired or distracted. While texting. Wear a helmet and other protective equipment during sports activities. If you have firearms in your house, make sure you follow all gun safety procedures. What's  next? Visit your health care provider once a year for an annual wellness visit. Ask your health care provider how often you should have your eyes and teeth checked. Stay up to date on all vaccines. This information is not intended to replace advice given to you by your health care provider. Make sure you discuss any questions you have with your healthcare provider. Document Revised: 11/05/2019 Document Reviewed: 10/12/2017 Elsevier Patient Education  2022 [employer].

## 2020-09-01 NOTE — Assessment & Plan Note (Signed)
Occurring 1-2 x monthly depending on the weather. Will increase Maxalt to 10 mg as she does require use of two 5 mg tablets for successful migraine abortion.

## 2020-09-01 NOTE — Assessment & Plan Note (Signed)
Improved, migraines occur once monthly on average. She is having to use 2 tablets of Maxalt which is effective for migraine abortion.  Will increase Maxalt to 10 mg. She will update.

## 2020-09-01 NOTE — Assessment & Plan Note (Signed)
Doing well on Singulair 10 mg, continue same.  Refills provided.

## 2020-09-01 NOTE — Assessment & Plan Note (Signed)
Immunizations UTD. Mammogram scheduled.  Discussed the importance of a healthy diet and regular exercise in order for weight loss, and to reduce the risk of further co-morbidity.  Exam today as noted. Labs pending.

## 2020-09-02 LAB — HEPATITIS C ANTIBODY
Hepatitis C Ab: NONREACTIVE
SIGNAL TO CUT-OFF: 0 (ref <1.00)

## 2020-09-02 LAB — HIV ANTIBODY (ROUTINE TESTING W REFLEX): HIV 1&2 Ab, 4th Generation: NONREACTIVE

## 2020-09-04 LAB — ESTROGENS, TOTAL: Estrogen: 310.5 pg/mL

## 2020-09-10 ENCOUNTER — Other Ambulatory Visit: Payer: Self-pay

## 2020-09-10 ENCOUNTER — Ambulatory Visit
Admission: RE | Admit: 2020-09-10 | Discharge: 2020-09-10 | Disposition: A | Discharge status: Home / Self Care | Payer: BC Managed Care – PPO | Source: Ambulatory Visit | Attending: Primary Care | Admitting: Primary Care

## 2020-09-10 DIAGNOSIS — Z1231 Encounter for screening mammogram for malignant neoplasm of breast: Secondary | ICD-10-CM | POA: Diagnosis present

## 2020-09-14 ENCOUNTER — Other Ambulatory Visit: Payer: Self-pay | Admitting: Primary Care

## 2020-09-14 DIAGNOSIS — N6310 Unspecified lump in the right breast, unspecified quadrant: Secondary | ICD-10-CM

## 2020-09-14 DIAGNOSIS — R928 Other abnormal and inconclusive findings on diagnostic imaging of breast: Secondary | ICD-10-CM

## 2020-09-18 ENCOUNTER — Ambulatory Visit
Admission: RE | Admit: 2020-09-18 | Discharge: 2020-09-18 | Disposition: A | Discharge status: Home / Self Care | Payer: BC Managed Care – PPO | Source: Ambulatory Visit | Attending: Primary Care | Admitting: Primary Care

## 2020-09-18 ENCOUNTER — Other Ambulatory Visit: Payer: Self-pay

## 2020-09-18 DIAGNOSIS — N6310 Unspecified lump in the right breast, unspecified quadrant: Secondary | ICD-10-CM | POA: Diagnosis present

## 2020-09-18 DIAGNOSIS — R928 Other abnormal and inconclusive findings on diagnostic imaging of breast: Secondary | ICD-10-CM | POA: Diagnosis present

## 2020-10-01 ENCOUNTER — Telehealth: Payer: BC Managed Care – PPO | Admitting: Physician Assistant

## 2020-10-01 ENCOUNTER — Ambulatory Visit: Payer: BC Managed Care – PPO

## 2020-10-01 DIAGNOSIS — J029 Acute pharyngitis, unspecified: Secondary | ICD-10-CM

## 2020-10-01 NOTE — Progress Notes (Signed)
Based on what you shared with me, I feel your condition warrants further evaluation and I recommend that you be seen in a face to face visit. Giving your constellation of symptoms now with white patches inside the cheeks and throat, I would be concerned of a thrush (yeast) versus a bacterial pharyngitis. Since I cannot look into the throat, I would recommend you actually be seen in person with PCP or at a local urgent care so that they can examine the throat and do strep testing, etc to determine cause so that you get the correct treatment.    NOTE: There will be NO CHARGE for this eVisit   If you are having a true medical emergency please call 911.      For an urgent face to face visit, [facility] has six urgent care centers for your convenience:     [facility] at [facility] Directions [phone] [address] [place], [place] [address]    [facility]) Get Driving Directions [phone] [address] [place], [place] [address]  [facility]) Get Driving Directions [phone] [address] [place],  [place]  [address]  [facility] at [facility] Get Driving Directions [phone] [address] [place], [place] [address]   [facility] Urgent Care at [facility] Get Driving Directions  [phone] [address] [place], [place] [address]   [facility] Urgent Care at [facility] Directions [phone] [address] [place], [place] [address]  Your MyChart E-visit questionnaire answers were reviewed by a board certified advanced clinical practitioner to complete your personal care plan based on your specific symptoms.  Thank you for using e-Visits.

## 2020-10-02 ENCOUNTER — Telehealth: Payer: BC Managed Care – PPO | Admitting: Physician Assistant

## 2020-10-02 DIAGNOSIS — L509 Urticaria, unspecified: Secondary | ICD-10-CM

## 2020-10-02 MED ORDER — TRIAMCINOLONE ACETONIDE 0.1 % EX CREA: 1.0000 "application " | TOPICAL_CREAM | Freq: Two times a day (BID) | CUTANEOUS | 0 refills | Status: DC

## 2020-10-02 MED ORDER — PREDNISONE 10 MG PO TABS: ORAL_TABLET | ORAL | 0 refills | Status: DC

## 2020-10-02 NOTE — Progress Notes (Signed)
E Visit for Rash  We are sorry that you are not feeling well. Here is how we plan to help!  Based on what you shared with me you may have an allergic reaction.  You may continue using benadryl.  I am going to prescribe a steroid cream and a prednisone taper.  If the rash persists, you will need a face to face exam.   I recommend you take Benadryl 25 mg - 50 mg every 4 hours to control the symptoms but if they last over 24 hours it is best that you see an office based provider for follow up.  Prednisone 10 mg daily for 6 days (see taper instructions below)  Directions for 6 day taper: Day 1: 2 tablets before breakfast, 1 after both lunch & dinner and 2 at bedtime Day 2: 1 tab before breakfast, 1 after both lunch & dinner and 2 at bedtime Day 3: 1 tab at each meal & 1 at bedtime Day 4: 1 tab at breakfast, 1 at lunch, 1 at bedtime Day 5: 1 tab at breakfast & 1 tab at bedtime Day 6: 1 tab at breakfast   HOME CARE:  Take cool showers and avoid direct sunlight. Apply cool compress or wet dressings. Take a bath in an oatmeal bath.  Sprinkle content of one Aveeno packet under running faucet with comfortably warm water.  Bathe for 15-20 minutes, 1-2 times daily.  Pat dry with a towel. Do not rub the rash. Use hydrocortisone cream. Take an antihistamine like Benadryl for widespread rashes that itch.  The adult dose of Benadryl is 25-50 mg by mouth 4 times daily. Caution:  This type of medication may cause sleepiness.  Do not drink alcohol, drive, or operate dangerous machinery while taking antihistamines.  Do not take these medications if you have prostate enlargement.  Read package instructions thoroughly on all medications that you take.  GET HELP RIGHT AWAY IF:  Symptoms don't go away after treatment. Severe itching that persists. If you rash spreads or swells. If you rash begins to smell. If it blisters and opens or develops a yellow-brown crust. You develop a fever. You have a sore  throat. You become short of breath.  MAKE SURE YOU:  Understand these instructions. Will watch your condition. Will get help right away if you are not doing well or get worse.  Thank you for choosing an e-visit.  Your e-visit answers were reviewed by a board certified advanced clinical practitioner to complete your personal care plan. Depending upon the condition, your plan could have included both over the counter or prescription medications.  Please review your pharmacy choice. Make sure the pharmacy is open so you can pick up prescription now. If there is a problem, you may contact your provider through [employer] and have the prescription routed to another pharmacy.  Your safety is important to [place]. If you have drug allergies check your prescription carefully.   For the next 24 hours you can use MyChart to ask questions about today's visit, request a non-urgent call back, or ask for a work or school excuse. You will get an email in the next two days asking about your experience. I hope that your e-visit has been valuable and will speed your recovery.   Greater than 5 minutes, yet less than 10 minutes of time have been spent researching, coordinating, and implementing care for this patient today

## 2020-11-02 ENCOUNTER — Ambulatory Visit: Payer: BC Managed Care – PPO | Admitting: Gastroenterology

## 2020-11-03 ENCOUNTER — Encounter: Payer: Self-pay | Admitting: *Deleted

## 2020-11-19 NOTE — Telephone Encounter (Signed)
Called patient after she sent message she started improving. If symptoms come back or has any questions she will give our office a call to set up a follow up.

## 2020-11-24 ENCOUNTER — Other Ambulatory Visit: Payer: Self-pay | Admitting: Primary Care

## 2020-11-24 DIAGNOSIS — F32A Depression, unspecified: Secondary | ICD-10-CM

## 2020-11-24 DIAGNOSIS — G43909 Migraine, unspecified, not intractable, without status migrainosus: Secondary | ICD-10-CM

## 2020-11-25 MED ORDER — RIZATRIPTAN BENZOATE 10 MG PO TABS: ORAL_TABLET | ORAL | 0 refills | Status: DC

## 2020-11-27 ENCOUNTER — Telehealth: Payer: BC Managed Care – PPO | Admitting: Physician Assistant

## 2020-11-27 DIAGNOSIS — J069 Acute upper respiratory infection, unspecified: Secondary | ICD-10-CM

## 2020-11-27 MED ORDER — BENZONATATE 100 MG PO CAPS: 100.0000 mg | ORAL_CAPSULE | Freq: Three times a day (TID) | ORAL | 0 refills | Status: DC | PRN

## 2020-11-27 MED ORDER — IPRATROPIUM BROMIDE 0.03 % NA SOLN: 2.0000 | Freq: Two times a day (BID) | NASAL | 12 refills | Status: DC

## 2020-11-27 NOTE — Progress Notes (Signed)

## 2020-12-29 ENCOUNTER — Telehealth: Payer: BC Managed Care – PPO | Admitting: Family

## 2020-12-29 DIAGNOSIS — B370 Candidal stomatitis: Secondary | ICD-10-CM | POA: Diagnosis not present

## 2020-12-29 DIAGNOSIS — K117 Disturbances of salivary secretion: Secondary | ICD-10-CM | POA: Diagnosis not present

## 2020-12-29 MED ORDER — FLUTICASONE PROPIONATE 50 MCG/ACT NA SUSP: 2.0000 | Freq: Every day | NASAL | 6 refills | Status: DC

## 2020-12-29 MED ORDER — NYSTATIN 100000 UNIT/ML MT SUSP: 5.0000 mL | Freq: Four times a day (QID) | OROMUCOSAL | 1 refills | Status: DC

## 2020-12-29 MED ORDER — CETIRIZINE HCL 10 MG PO TABS: 10.0000 mg | ORAL_TABLET | Freq: Every day | ORAL | 3 refills | Status: DC

## 2020-12-29 NOTE — Progress Notes (Signed)
Virtual Visit Consent   Meghan Jones, you are scheduled for a virtual visit with a [facility] provider today.     Just as with appointments in the office, your consent must be obtained to participate.  Your consent will be active for this visit and any virtual visit you may have with one of our providers in the next 365 days.     If you have a MyChart account, a copy of this consent can be sent to you electronically.  All virtual visits are billed to your insurance company just like a traditional visit in the office.    As this is a virtual visit, video technology does not allow for your provider to perform a traditional examination.  This may limit your provider's ability to fully assess your condition.  If your provider identifies any concerns that need to be evaluated in person or the need to arrange testing (such as labs, EKG, etc.), we will make arrangements to do so.     Although advances in technology are sophisticated, we cannot ensure that it will always work on either your end or our end.  If the connection with a video visit is poor, the visit may have to be switched to a telephone visit.  With either a video or telephone visit, we are not always able to ensure that we have a secure connection.     I need to obtain your verbal consent now.   Are you willing to proceed with your visit today?    Meghan Jones has provided verbal consent on 12/29/2020 for a virtual visit (video or telephone).   Meghan Rodney, FNP   Date: 12/29/2020 5:43 PM   Virtual Visit via Video Note   I, Meghan Jones, connected with  Meghan Jones  ([id number], [birth year]) on 12/29/20 at  5:45 PM EST by a video-enabled telemedicine application and verified that I am speaking with the correct person using two identifiers.  Location: Patient: Virtual Visit Location Patient: Home Provider: Virtual Visit Location Provider: Home Office   I discussed the limitations of evaluation and management by  telemedicine and the availability of in person appointments. The patient expressed understanding and agreed to proceed.    History of Present Illness: Meghan Jones is a 40 y.o. who identifies as a female who was assigned female at birth, and is being seen today for complaints of excess of salvia that started last week and has become more consistent over the last 5 days. Denies any fever, congestion, sore throat or GERD symptoms.  She feels like there is a coating on her tongue and is more white than usual.   HPI: HPI  Problems:  Patient Active Problem List   Diagnosis Date Noted   Abdominal pain 09/01/2020   Acute otalgia, left 05/22/2020   Fatigue 05/22/2020   Pleuritic chest pain 03/15/2019   Migraine 12/10/2018   Decreased renal function 04/03/2018   Frequent headaches 04/03/2018   Seasonal allergic rhinitis 05/10/2017   Encounter for annual general medical examination with abnormal findings in adult 02/06/2013   Anxiety and depression 02/06/2013    Allergies: No Known Allergies Medications:  Current Outpatient Medications:    cetirizine (ZYRTEC) 10 MG tablet, Take 1 tablet (10 mg total) by mouth daily., Disp: 90 tablet, Rfl: 3   nystatin (MYCOSTATIN) 100000 UNIT/ML suspension, Take 5 mLs (500,000 Units total) by mouth 4 (four) times daily., Disp: 473 mL, Rfl: 1   fluticasone (FLONASE) 50 MCG/ACT nasal spray, Place  2 sprays into both nostrils daily., Disp: 16 g, Rfl: 6   Multiple Vitamin (MULTIVITAMIN) tablet, Take 1 tablet by mouth daily. Reported on 06/12/2015, Disp: , Rfl:    rizatriptan (MAXALT) 10 MG tablet, Take 1 tablet by mouth at migraine onset. May repeat in 2 hours if needed, Disp: 10 tablet, Rfl: 0   sertraline (ZOLOFT) 50 MG tablet, TAKE 1 TABLET (50 MG TOTAL) BY MOUTH DAILY. FOR ANXIETY., Disp: 90 tablet, Rfl: 2  Observations/Objective: Patient is well-developed, well-nourished in no acute distress.  Resting comfortably  at home.  Head is normocephalic,  atraumatic.  No labored breathing.  Speech is clear and coherent with logical content.  Patient is alert and oriented at baseline.  Tongue with mild white coating  Assessment and Plan: 1. Excessive salivation - cetirizine (ZYRTEC) 10 MG tablet; Take 1 tablet (10 mg total) by mouth daily.  Dispense: 90 tablet; Refill: 3 - fluticasone (FLONASE) 50 MCG/ACT nasal spray; Place 2 sprays into both nostrils daily.  Dispense: 16 g; Refill: 6  2. Oral thrush - nystatin (MYCOSTATIN) 100000 UNIT/ML suspension; Take 5 mLs (500,000 Units total) by mouth 4 (four) times daily.  Dispense: 473 mL; Refill: 1  Continue zyrtec and flonase daily to help dry salvia up Force fluids Will give nystatin as QID  Rinse mouth after meals  Follow Up Instructions: I discussed the assessment and treatment plan with the patient. The patient was provided an opportunity to ask questions and all were answered. The patient agreed with the plan and demonstrated an understanding of the instructions.  A copy of instructions were sent to the patient via MyChart unless otherwise noted below.     The patient was advised to call back or seek an in-person evaluation if the symptoms worsen or if the condition fails to improve as anticipated.  Time:  I spent 16 minutes with the patient via telehealth technology discussing the above problems/concerns.    Meghan Dun, FNP

## 2021-01-12 ENCOUNTER — Encounter: Payer: Self-pay | Admitting: Primary Care

## 2021-01-12 ENCOUNTER — Other Ambulatory Visit: Payer: Self-pay

## 2021-01-12 ENCOUNTER — Ambulatory Visit: Payer: BC Managed Care – PPO | Admitting: Primary Care

## 2021-01-12 VITALS — BP 126/68 | HR 68 | Temp 98.1°F | Ht 66.0 in | Wt 191.0 lb

## 2021-01-12 DIAGNOSIS — Z23 Encounter for immunization: Secondary | ICD-10-CM | POA: Diagnosis not present

## 2021-01-12 DIAGNOSIS — E669 Obesity, unspecified: Secondary | ICD-10-CM | POA: Diagnosis not present

## 2021-01-12 MED ORDER — WEGOVY 0.25 MG/0.5ML ~~LOC~~ SOAJ: 0.2500 mg | SUBCUTANEOUS | 0 refills | Status: DC

## 2021-01-12 NOTE — Assessment & Plan Note (Signed)
Chronically overweight, now obese. Discussed the absolute need to improve her diet and add in exercise.   Agree to provide [facility] for once weekly treatment. Will start with 0.25 mg once weekly x 4 weeks, then increase to 0.5 mg once weekly.  She will notify us when she is needing the 0.5 mg weekly dose.  We will plan to see her back for follow up in 6 weeks. Checking labs today.

## 2021-01-12 NOTE — Patient Instructions (Signed)
Start Wegovy once weekly for obesity. Inject 0.25 mg once weekly for 4 weeks.   Message me when you use your last 0.25 mg pen and I will send in the 0.5 mg dose.  Cut out snacking at night.  Start exercising. You should be getting 150 minutes of moderate intensity exercise weekly.  We will see you back in 6 weeks.   It was a pleasure to see you today!

## 2021-01-12 NOTE — Progress Notes (Signed)
Subjective:    Patient ID: Meghan Jones, female    DOB: 30-Jul-1980, 40 y.o.   MRN: [id number]  HPI  Meghan Jones is a very pleasant 40 y.o. female with a history of obesity, migraines, mild hyperlipemia who presents today to discuss obesity.   Significant family history of obesity in immediate and extended family members. Also with chronic overweight for years, obesity range for the last 4-5 months, chronic joint aches to knees and sees orthopedics for knee injections.  She is interested in trying [facility] or some other weight loss option. She's tried weight watchers multiple times, intuitive eating, counting macros, intermittent fasting, keto diet without success as she gets frustrated with tracking.   She is interested in trying [facility] as her friend has had a lot of success with weight loss. She is tracking her weight once weekly which ranges 185-189 pounds.   Diet currently consists of:  Breakfast: Protein shake (non-dairy), cheese stick, english muffin, fruit, granola bar.  Lunch: Chicken, vegetables - left overs from dinner Dinner: Meat, veggies, starch, pasta, mac and cheese Snacks: Chips, cheese dip, chocolate chips.  Desserts: Daily, multiple times daily Beverages: Water, energy powder, crystal light, regular or diet soda  Exercise: None   Wt Readings from Last 3 Encounters:  01/12/21 191 lb (86.6 kg)  09/01/20 186 lb (84.4 kg)  05/22/20 181 lb 6 oz (82.3 kg)      Review of Systems  Respiratory:  Negative for shortness of breath.   Cardiovascular:  Negative for chest pain.  Gastrointestinal:  Negative for constipation and diarrhea.  Neurological:  Negative for headaches.        Past Medical History:  Diagnosis Date   Allergy    Constipation    Controlled with Shakeology through [facility]   Depression    Mild    Social History   Socioeconomic History   Marital status: Married    Spouse name: Meghan Jones   Number of children: 1   Years of education: 18    Highest education level: Not on file  Occupational History   Occupation: Industrial/product designer: [employer]    Comment: Civil engineer, contracting  Tobacco Use   Smoking status: Never   Smokeless tobacco: Never  Substance and Sexual Activity   Alcohol use: Yes    Alcohol/week: 2.0 standard drinks    Types: 1 Glasses of wine, 1 Shots of liquor per week    Comment: Occassionally 2 drinks weekly   Drug use: No   Sexual activity: Yes    Birth control/protection: Surgical  Other Topics Concern   Not on file  Social History Narrative   Meghan Jones grew up in [place], [place].    She attended [employer] in [place] ([employer]) and obtained her bachelors in Sport and exercise psychologist.    She then attended [employer] and obtained her Masters in [employer].    She lives at home with her husband Meghan Jones) and daughter Meghan Jones).    She enjoys working out Education officer, community.     Social Determinants of Health   Financial Resource Strain: Not on file  Food Insecurity: Not on file  Transportation Needs: Not on file  Physical Activity: Not on file  Stress: Not on file  Social Connections: Not on file  Intimate Partner Violence: Not on file    Past Surgical History:  Procedure Laterality Date   ABDOMINAL HYSTERECTOMY  2011   Asherman's Syndrome   APPENDECTOMY  2000  Family History  Problem Relation Age of Onset   Hyperlipidemia Maternal Grandfather    Hyperlipidemia Paternal Grandmother    Heart disease Paternal Grandmother 55       CAD - Died MI   Mental illness Paternal Grandmother    Hypertension Paternal Grandmother    Depression Paternal Grandmother        Suicide attempt x 3   Obesity Paternal Grandmother    Diabetes Paternal Grandmother    Hyperlipidemia Paternal Grandfather    Obesity Paternal Grandfather    Hypertension Paternal Grandfather    Diabetes Paternal Grandfather    Depression Sister    Breast cancer Neg Hx     No Known  Allergies  Current Outpatient Medications on File Prior to Visit  Medication Sig Dispense Refill   cetirizine (ZYRTEC) 10 MG tablet Take 1 tablet (10 mg total) by mouth daily. 90 tablet 3   fluticasone (FLONASE) 50 MCG/ACT nasal spray Place 2 sprays into both nostrils daily. 16 g 6   Multiple Vitamin (MULTIVITAMIN) tablet Take 1 tablet by mouth daily. Reported on 06/12/2015     rizatriptan (MAXALT) 10 MG tablet Take 1 tablet by mouth at migraine onset. May repeat in 2 hours if needed 10 tablet 0   sertraline (ZOLOFT) 50 MG tablet TAKE 1 TABLET (50 MG TOTAL) BY MOUTH DAILY. FOR ANXIETY. 90 tablet 2   No current facility-administered medications on file prior to visit.    BP 126/68   Pulse 68   Temp 98.1 F (36.7 C) (Temporal)   Ht _0  (1.676 m)   Wt 191 lb (86.6 kg)   SpO2 98%   BMI 30.83 kg/m  Objective:   Physical Exam Cardiovascular:     Rate and Rhythm: Normal rate and regular rhythm.  Pulmonary:     Effort: Pulmonary effort is normal.     Breath sounds: Normal breath sounds.  Musculoskeletal:     Cervical back: Neck supple.  Skin:    General: Skin is warm and dry.          Assessment & Plan:      This visit occurred during the SARS-CoV-2 public health emergency.  Safety protocols were in place, including screening questions prior to the visit, additional usage of staff PPE, and extensive cleaning of exam room while observing appropriate contact time as indicated for disinfecting solutions.

## 2021-01-13 ENCOUNTER — Other Ambulatory Visit: Payer: Self-pay | Admitting: Primary Care

## 2021-01-13 DIAGNOSIS — N6310 Unspecified lump in the right breast, unspecified quadrant: Secondary | ICD-10-CM

## 2021-01-13 LAB — COMPREHENSIVE METABOLIC PANEL
ALT: 13 U/L (ref 0–35)
AST: 17 U/L (ref 0–37)
Albumin: 4.7 g/dL (ref 3.5–5.2)
Alkaline Phosphatase: 48 U/L (ref 39–117)
BUN: 16 mg/dL (ref 6–23)
CO2: 27 mEq/L (ref 19–32)
Calcium: 9.7 mg/dL (ref 8.4–10.5)
Chloride: 103 mEq/L (ref 96–112)
Creatinine, Ser: 0.92 mg/dL (ref 0.40–1.20)
GFR: 77.38 mL/min (ref >60.00)
Glucose, Bld: 88 mg/dL (ref 70–99)
Potassium: 4.1 mEq/L (ref 3.5–5.1)
Sodium: 138 mEq/L (ref 135–145)
Total Bilirubin: 0.5 mg/dL (ref 0.2–1.2)
Total Protein: 7.3 g/dL (ref 6.0–8.3)

## 2021-01-13 LAB — HEMOGLOBIN A1C: Hgb A1c MFr Bld: 5.2 % (ref 4.6–6.5)

## 2021-01-20 NOTE — Telephone Encounter (Signed)
Authorization received effective 01/15/2021-08/15/2021. Copy of letter sent to fax.

## 2021-01-28 NOTE — Telephone Encounter (Signed)
Joellen, I'm not sure what she's referring to regarding an "esig", can you help?   Also, find out more information regarding her oral symptoms. It looks like she completed an e-visit for this. The Zyrtec will dry her out. Any swelling to the inside of her cheeks?

## 2021-01-29 NOTE — Telephone Encounter (Signed)
It was regarding the order for the ultrasound. Let her know that looks like it has been signed. She will call and try to make appointment and let us know if any issues.   She was seen and treated for thrush but not having any improvement. Started 2 months ago. Tongue  dry as day goes on. No swelling in mouth or cheeks. She has been on zyrtec for years and never had problems in the past. She has been super hydrated with clear urine. Thinks may be do to the zoloft. She took 1/2 dose on 12/14 and none on 12/15 and did not have any symptoms.

## 2021-02-23 ENCOUNTER — Ambulatory Visit: Payer: BC Managed Care – PPO | Admitting: Primary Care

## 2021-02-25 DIAGNOSIS — F32A Depression, unspecified: Secondary | ICD-10-CM

## 2021-02-25 DIAGNOSIS — G43909 Migraine, unspecified, not intractable, without status migrainosus: Secondary | ICD-10-CM

## 2021-02-25 DIAGNOSIS — E669 Obesity, unspecified: Secondary | ICD-10-CM

## 2021-02-25 DIAGNOSIS — K117 Disturbances of salivary secretion: Secondary | ICD-10-CM

## 2021-02-25 DIAGNOSIS — J302 Other seasonal allergic rhinitis: Secondary | ICD-10-CM

## 2021-02-25 NOTE — Telephone Encounter (Signed)
Fyi.

## 2021-03-09 MED ORDER — ESCITALOPRAM OXALATE 10 MG PO TABS: 10.0000 mg | ORAL_TABLET | Freq: Every day | ORAL | 0 refills | Status: DC

## 2021-03-09 MED ORDER — WEGOVY 0.5 MG/0.5ML ~~LOC~~ SOAJ: 0.5000 mg | SUBCUTANEOUS | 1 refills | Status: DC

## 2021-03-29 ENCOUNTER — Other Ambulatory Visit: Payer: Self-pay

## 2021-03-29 ENCOUNTER — Ambulatory Visit
Admission: RE | Admit: 2021-03-29 | Discharge: 2021-03-29 | Disposition: A | Discharge status: Home / Self Care | Payer: BC Managed Care – PPO | Source: Ambulatory Visit | Attending: Primary Care | Admitting: Primary Care

## 2021-03-29 DIAGNOSIS — N6310 Unspecified lump in the right breast, unspecified quadrant: Secondary | ICD-10-CM | POA: Diagnosis present

## 2021-04-07 MED ORDER — RIZATRIPTAN BENZOATE 10 MG PO TABS: ORAL_TABLET | ORAL | 0 refills | Status: DC

## 2021-04-09 ENCOUNTER — Other Ambulatory Visit: Payer: Self-pay | Admitting: Primary Care

## 2021-04-09 DIAGNOSIS — F32A Depression, unspecified: Secondary | ICD-10-CM

## 2021-04-09 DIAGNOSIS — F419 Anxiety disorder, unspecified: Secondary | ICD-10-CM

## 2021-04-11 MED ORDER — FLUTICASONE PROPIONATE 50 MCG/ACT NA SUSP: 1.0000 | Freq: Two times a day (BID) | NASAL | 0 refills | Status: DC | PRN

## 2021-04-11 MED ORDER — MONTELUKAST SODIUM 10 MG PO TABS: 10.0000 mg | ORAL_TABLET | Freq: Every day | ORAL | 0 refills | Status: DC

## 2021-04-13 ENCOUNTER — Emergency Department: Payer: BC Managed Care – PPO

## 2021-04-13 ENCOUNTER — Ambulatory Visit: Payer: BC Managed Care – PPO | Admitting: Family

## 2021-04-13 ENCOUNTER — Encounter: Payer: Self-pay | Admitting: Family

## 2021-04-13 ENCOUNTER — Encounter: Payer: Self-pay | Admitting: Emergency Medicine

## 2021-04-13 ENCOUNTER — Emergency Department
Admission: EM | Admit: 2021-04-13 | Discharge: 2021-04-13 | Disposition: A | Discharge status: Home / Self Care | Payer: BC Managed Care – PPO | Attending: Emergency Medicine | Admitting: Emergency Medicine

## 2021-04-13 ENCOUNTER — Other Ambulatory Visit: Payer: Self-pay

## 2021-04-13 VITALS — BP 124/82 | HR 80 | Ht 66.0 in | Wt 188.0 lb

## 2021-04-13 DIAGNOSIS — K219 Gastro-esophageal reflux disease without esophagitis: Secondary | ICD-10-CM | POA: Diagnosis not present

## 2021-04-13 DIAGNOSIS — R0602 Shortness of breath: Secondary | ICD-10-CM | POA: Insufficient documentation

## 2021-04-13 DIAGNOSIS — R748 Abnormal levels of other serum enzymes: Secondary | ICD-10-CM | POA: Diagnosis not present

## 2021-04-13 DIAGNOSIS — R0789 Other chest pain: Secondary | ICD-10-CM

## 2021-04-13 DIAGNOSIS — K859 Acute pancreatitis without necrosis or infection, unspecified: Secondary | ICD-10-CM | POA: Diagnosis not present

## 2021-04-13 DIAGNOSIS — R079 Chest pain, unspecified: Secondary | ICD-10-CM | POA: Insufficient documentation

## 2021-04-13 DIAGNOSIS — R072 Precordial pain: Secondary | ICD-10-CM | POA: Diagnosis present

## 2021-04-13 LAB — CBC
HCT: 41.7 % (ref 36.0–46.0)
Hemoglobin: 14.3 g/dL (ref 12.0–15.0)
MCH: 29.1 pg (ref 26.0–34.0)
MCHC: 34.3 g/dL (ref 30.0–36.0)
MCV: 84.9 fL (ref 80.0–100.0)
Platelets: 367 10*3/uL (ref 150–400)
RBC: 4.91 MIL/uL (ref 3.87–5.11)
RDW: 11.9 % (ref 11.5–15.5)
WBC: 9.8 10*3/uL (ref 4.0–10.5)
nRBC: 0 % (ref 0.0–0.2)

## 2021-04-13 LAB — COMPREHENSIVE METABOLIC PANEL
ALT: 14 U/L (ref 0–44)
AST: 18 U/L (ref 15–41)
Albumin: 4.6 g/dL (ref 3.5–5.0)
Alkaline Phosphatase: 49 U/L (ref 38–126)
Anion gap: 7 (ref 5–15)
BUN: 12 mg/dL (ref 6–20)
CO2: 28 mmol/L (ref 22–32)
Calcium: 9.8 mg/dL (ref 8.9–10.3)
Chloride: 104 mmol/L (ref 98–111)
Creatinine, Ser: 0.92 mg/dL (ref 0.44–1.00)
GFR, Estimated: >60 mL/min (ref >60)
Glucose, Bld: 94 mg/dL (ref 70–99)
Potassium: 3.7 mmol/L (ref 3.5–5.1)
Sodium: 139 mmol/L (ref 135–145)
Total Bilirubin: 0.6 mg/dL (ref 0.3–1.2)
Total Protein: 7.3 g/dL (ref 6.5–8.1)

## 2021-04-13 LAB — TROPONIN I (HIGH SENSITIVITY)
Troponin I (High Sensitivity): 3 ng/L (ref <18)
Troponin I (High Sensitivity): 3 ng/L (ref <18)

## 2021-04-13 LAB — D-DIMER, QUANTITATIVE: D-Dimer, Quant: 0.36 ug/mL-FEU (ref 0.00–0.50)

## 2021-04-13 LAB — LIPASE, BLOOD: Lipase: 87 U/L — ABNORMAL HIGH (ref 11–51)

## 2021-04-13 MED ORDER — KETOROLAC TROMETHAMINE 30 MG/ML IJ SOLN
15.0000 mg | Freq: Once | INTRAMUSCULAR | Status: AC
  Administered 2021-04-13: 15 mg via INTRAVENOUS
  Filled 2021-04-13: qty 1

## 2021-04-13 MED ORDER — FAMOTIDINE IN NACL 20-0.9 MG/50ML-% IV SOLN
20.0000 mg | Freq: Once | INTRAVENOUS | Status: AC
  Administered 2021-04-13: 20 mg via INTRAVENOUS
  Filled 2021-04-13: qty 50

## 2021-04-13 MED ORDER — TRAMADOL HCL 50 MG PO TABS
50.0000 mg | ORAL_TABLET | Freq: Four times a day (QID) | ORAL | 0 refills | Status: DC | PRN
Start: 2021-04-13 — End: 2021-04-22

## 2021-04-13 MED ORDER — PANTOPRAZOLE SODIUM 40 MG PO TBEC: 40.0000 mg | DELAYED_RELEASE_TABLET | Freq: Two times a day (BID) | ORAL | 0 refills | Status: DC

## 2021-04-13 NOTE — ED Triage Notes (Signed)
Pt via POV from home. Pt c/o chest pain that radiates to her back, also endorses some mild SOB. Denies any significant cardiac hx. Pt is A&OX4 and NAD

## 2021-04-13 NOTE — ED Provider Notes (Signed)
[facility] Provider Note    Event Date/Time   First MD Initiated Contact with Patient 04/13/21 1515     (approximate)   History   Chest Pain   HPI  Meghan Jones is a 41 y.o. female with no active medical problems who presents with chest pain over the last 4 days, persistent course, described as an intermittent stabbing sensation mainly just to the left of her sternum.  It is associated with some shortness of breath and sometimes with certain positions especially leaning forward.  She denies associated weakness or lightheadedness and has no nausea or vomiting.  She denies any leg swelling.  She has no prior history of this pain.  She has taken Tums, a gas relief pill, and Tylenol with no relief.  She was seen by her primary care today and referred to the ED.      Physical Exam   Triage Vital Signs: ED Triage Vitals  Enc Vitals Group     BP 04/13/21 1327 (!) 151/86     Pulse Rate 04/13/21 1327 70     Resp 04/13/21 1327 18     Temp 04/13/21 1327 98.6 F (37 C)     Temp Source 04/13/21 1327 Oral     SpO2 04/13/21 1327 98 %     Weight 04/13/21 1324 183 lb (83 kg)     Height 04/13/21 1324 5\' 6"  (1.676 m)     Head Circumference --      Peak Flow --      Pain Score 04/13/21 1324 3     Pain Loc --      Pain Edu? --      Excl. in GC? --     Most recent vital signs: Vitals:   04/13/21 1625 04/13/21 1837  BP: 106/74 120/79  Pulse: 78 82  Resp: 17 18  Temp:    SpO2: 98% 98%     General: Awake, no distress.  CV:  Good peripheral perfusion.  Heart sounds normal. Resp:  Normal effort.  Lungs CTAB. Abd:  No distention.  Other:  No calf or popliteal swelling or tenderness.   ED Results / Procedures / Treatments   Labs (all labs ordered are listed, but only abnormal results are displayed) Labs Reviewed  LIPASE, BLOOD - Abnormal; Notable for the following components:      Result Value   Lipase 87 (*)    All other components within normal  limits  CBC  COMPREHENSIVE METABOLIC PANEL  D-DIMER, QUANTITATIVE  POC URINE PREG, ED  TROPONIN I (HIGH SENSITIVITY)  TROPONIN I (HIGH SENSITIVITY)     EKG  ED ECG REPORT I, 04/15/21, the attending physician, personally viewed and interpreted this ECG.  Date: 04/13/2021 EKG Time: 1325 Rate: 98 Rhythm: normal sinus rhythm QRS Axis: normal Intervals: normal ST/T Wave abnormalities: Nonspecific T wave abnormalities Narrative Interpretation: Nonspecific abnormalities with no evidence of acute ischemia; no recent prior EKG available for comparison, but no dynamic change when compared to EKG of 1258 today    RADIOLOGY  Chest x-ray: I independently viewed and interpreted the images; there is no focal consolidation or edema  04/15/2021 abdomen RUQ: No gallstones, dilated CBD, or other acute abnormality  PROCEDURES:  Critical Care performed: No  Procedures   MEDICATIONS ORDERED IN ED: Medications  ketorolac (TORADOL) 30 MG/ML injection 15 mg (15 mg Intravenous Given 04/13/21 1541)  famotidine (PEPCID) IVPB 20 mg premix (20 mg Intravenous New Bag/Given 04/13/21 1839)  IMPRESSION / MDM / ASSESSMENT AND PLAN / ED COURSE  I reviewed the triage vital signs and the nursing notes.  41 year old female with no cardiac history or risk factors presents with atypical chest pain over the last 4 days which is somewhat worsened today.  On exam the patient is overall well-appearing.  Her vital signs are normal.  Physical exam is otherwise unremarkable.  EKG is nonischemic.  Chest x-ray shows no acute abnormality.  I reviewed the past medical records; the patient was evaluated by FNP Dugal at [facility] primary care today, had an EKG there that was reassuring but was referred to the ED for rule out MI.  Overall presentation is most consistent with musculoskeletal pain or GERD given the sharp, atypical quality, positional nature, and the patient's age and lack of risk factors; however  differential includes ACS and PE, although overall PE risk is low.  There is no clinical evidence for aortic dissection or other vascular cause given the patient's age, stable vital signs, and type and quality of the pain.  We will obtain basic labs, troponins x2, D-dimer, and reassess.  The patient is on the cardiac monitor to evaluate for evidence of arrhythmia and/or significant heart rate changes.  ----------------------------------------- 6:43 PM on 04/13/2021 -----------------------------------------  D-dimer and troponins x2 are negative, however the patient still has fairly significant chest pain.  The lipase is slightly elevated, however the patient has no epigastric pain, nausea or vomiting, jaundice, or any T abnormality.  My clinical suspicion for pancreatitis is low.  However she does describe a lot of gas and belching.  She may be having pain due to GERD related to the pancreatitis.  She denies alcohol use.  We will obtain a right upper quadrant ultrasound to evaluate for gallstones, give IV Pepcid, and reassess.  ----------------------------------------- 8:10 PM on 04/13/2021 -----------------------------------------  The patient is feeling somewhat better after the IV Pepcid, and states that she is having more significant gas, belching, and reflux sensation.  Ultrasound shows no acute abnormality.  The etiology of the mild pancreatitis is unclear, however it appears that the patient's main symptoms are being caused by acid reflux.  She feels well enough that she would like to go home.  I did consider and discussed with the patient whether her symptoms would warrant admission, however given her overall well appearance, stable vital signs, and reassuring work-up, it is reasonable for her to go home.  She will follow-up closely with her PMD.  I gave her very thorough return precautions and instructed her to eat a bland diet for the next several days.  She expressed understanding  and agreement.    FINAL CLINICAL IMPRESSION(S) / ED DIAGNOSES   Final diagnoses:  Pancreatitis  Atypical chest pain  Gastroesophageal reflux disease, unspecified whether esophagitis present     Rx / DC Orders   ED Discharge Orders          Ordered    traMADol (ULTRAM) 50 MG tablet  Every 6 hours PRN        04/13/21 2005    pantoprazole (PROTONIX) 40 MG tablet  2 times daily        04/13/21 2005             Note:  This document was prepared using Dragon voice recognition software and may include unintentional dictation errors.    Dionne Bucy, MD 04/13/21 2011

## 2021-04-13 NOTE — Discharge Instructions (Addendum)
Your lipase, which is a pancreas enzyme, is slightly elevated.  All of your other blood work including your liver function tests, bilirubin, white blood cell count, cardiac enzymes, and D-dimer were normal.  Ultrasound of your liver and gallbladder is normal.  Overall we suspect that you are having mild pancreatitis and pain mainly due to acid reflux.  Take the Protonix twice daily over the next several weeks.  Take the tramadol as needed for pain over the next couple of days.  Contact your primary care doctor and follow-up within the next week.  Your lipase level should be rechecked.  You may need referral to a gastroenterologist if it is still elevated.  You should consume a bland diet over the next few days as discussed in order to decrease inflammation in the pancreas and decrease the acid reflux.  Return to the emergency department immediately for new, worsening, or persistent severe pain, difficulty breathing, vomiting, fever, weakness or lightheadedness, or any other new or worsening symptoms that concern you.

## 2021-04-13 NOTE — Progress Notes (Signed)
Established Patient Office Visit  Subjective:  Patient ID: Meghan Jones, female    DOB: January 30, 1981  Age: 41 y.o. MRN: [id number]  CC:  Chief Complaint  Patient presents with   Chest Pain    Pt stated--chest pain, especially bending worse or eating--5 day. Tried gas relief pills.    HPI Meghan Jones is here today with concerns.   Five nights ago with chest pain, shortly after eating. Thought maybe reflux/heart burn.  Tried some tums with really no relief.  Doesn't seem to have gone away, ache is almost constant but not stabbing, squeezing very sharp pain. Not occurring right now but did occur a few minutes ago. Couple times each hour, increasing in frequency.   Does feel a bit of shortness of breath, feels heaviness on her chest. Hard to take a deep breath.  Maybe some upper back pain.   Burping often. No cough.  No abdominal pain.  Does feel a little hoarse.    Past Medical History:  Diagnosis Date   Allergy    Constipation    Controlled with Shakeology through [facility]   Depression    Mild    Past Surgical History:  Procedure Laterality Date   ABDOMINAL HYSTERECTOMY  2011   Asherman's Syndrome   APPENDECTOMY  2000    Family History  Problem Relation Age of Onset   Hyperlipidemia Maternal Grandfather    Hyperlipidemia Paternal Grandmother    Heart disease Paternal Grandmother 21       CAD - Died MI   Mental illness Paternal Grandmother    Hypertension Paternal Grandmother    Depression Paternal Grandmother        Suicide attempt x 3   Obesity Paternal Grandmother    Diabetes Paternal Grandmother    Hyperlipidemia Paternal Grandfather    Obesity Paternal Grandfather    Hypertension Paternal Grandfather    Diabetes Paternal Grandfather    Depression Sister    Breast cancer Neg Hx     Social History   Socioeconomic History   Marital status: Married    Spouse name: [name]   Number of children: 1   Years of education: 18   Highest education  level: Not on file  Occupational History   Occupation: Mining engineer: [employer]    Comment: [employer]  Tobacco Use   Smoking status: Never   Smokeless tobacco: Never  Substance and Sexual Activity   Alcohol use: Yes    Alcohol/week: 2.0 standard drinks    Types: 1 Glasses of wine, 1 Shots of liquor per week    Comment: Occassionally 2 drinks weekly   Drug use: No   Sexual activity: Yes    Birth control/protection: Surgical  Other Topics Concern   Not on file  Social History Narrative   Angalena grew up in [place], [place].    She attended [address] [employer] [place] in [place] ([employer]) and obtained her bachelors in Psychologist, sport and exercise.    She then attended [employer] and obtained her Masters in [employer].    She lives at home with her husband [name]) and daughter [name]).    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     Social Determinants of Health   Financial Resource Strain: Not on file  Food Insecurity: Not on file  Transportation Needs: Not on file  Physical Activity: Not on file  Stress: Not on file  Social Connections: Not on file  Intimate Partner Violence:  Not on file    Outpatient Medications Prior to Visit  Medication Sig Dispense Refill   cetirizine (ZYRTEC) 10 MG tablet Take 1 tablet (10 mg total) by mouth daily. 90 tablet 3   escitalopram (LEXAPRO) 10 MG tablet Take 1 tablet (10 mg total) by mouth daily. For anxiety. 30 tablet 0   fluticasone (FLONASE) 50 MCG/ACT nasal spray Place 1 spray into both nostrils 2 (two) times daily as needed for allergies or rhinitis. 48 g 0   montelukast (SINGULAIR) 10 MG tablet Take 1 tablet (10 mg total) by mouth at bedtime. For allergies. 90 tablet 0   Multiple Vitamin (MULTIVITAMIN) tablet Take 1 tablet by mouth daily. Reported on 06/12/2015     rizatriptan (MAXALT) 10 MG tablet Take 1 tablet by mouth at migraine onset. May repeat in 2 hours if needed 10 tablet 0    Semaglutide-Weight Management (WEGOVY) 0.5 MG/0.5ML SOAJ Inject 0.5 mg into the skin once a week. 2 mL 1   No facility-administered medications prior to visit.    No Known Allergies  ROS Review of Systems  Constitutional:  Negative for chills and fever.  HENT:  Negative for congestion, ear pain, sinus pressure and sore throat.   Respiratory:  Positive for shortness of breath. Negative for cough and wheezing.   Cardiovascular:  Positive for chest pain. Negative for palpitations.     Objective:    Physical Exam Constitutional:      General: She is in acute distress (when chest pain occurs).     Appearance: She is well-developed and normal weight. She is not ill-appearing, toxic-appearing or diaphoretic.  Cardiovascular:     Rate and Rhythm: Normal rate and regular rhythm. No extrasystoles are present.    Heart sounds:  No systolic murmur is present.  Pulmonary:     Effort: Pulmonary effort is normal.     Breath sounds: Normal breath sounds.  Abdominal:     General: Abdomen is flat. Bowel sounds are normal. There is no distension.     Palpations: Abdomen is soft. There is no mass.     Tenderness: There is no abdominal tenderness. There is no guarding.  Neurological:     Mental Status: She is alert.    BP 124/82    Pulse 80    Ht 5\' 6"  (1.676 m)    Wt 188 lb (85.3 kg)    SpO2 98%    BMI 30.34 kg/m  Wt Readings from Last 3 Encounters:  04/13/21 183 lb (83 kg)  04/13/21 188 lb (85.3 kg)  01/12/21 191 lb (86.6 kg)     Health Maintenance Due  Topic Date Due   COVID-19 Vaccine (4 - Booster for Pfizer series) 02/14/2020    There are no preventive care reminders to display for this patient.  Lab Results  Component Value Date   TSH 1.27 05/22/2020   Lab Results  Component Value Date   WBC 9.6 05/22/2020   HGB 14.3 05/22/2020   HCT 42.3 05/22/2020   MCV 88.3 05/22/2020   PLT 372 05/22/2020   Lab Results  Component Value Date   NA 138 01/12/2021   K 4.1 01/12/2021    CO2 27 01/12/2021   GLUCOSE 88 01/12/2021   BUN 16 01/12/2021   CREATININE 0.92 01/12/2021   BILITOT 0.5 01/12/2021   ALKPHOS 48 01/12/2021   AST 17 01/12/2021   ALT 13 01/12/2021   PROT 7.3 01/12/2021   ALBUMIN 4.7 01/12/2021   CALCIUM 9.7 01/12/2021  GFR 77.38 01/12/2021   Lab Results  Component Value Date   HGBA1C 5.2 01/12/2021      Assessment & Plan:   Problem List Items Addressed This Visit       Other   Chest pain at rest - Primary    ekg in office NSR  However pt is physical pain with tearful eyes when episode of chest pain occurs in exam room.  Unable to appropriately determine if this is non-STEMI versus moderate to severe heartburn. Patient refuses EMS patient stable at current and advised to go to the emergency room [facility] for triage If any worsening chest pain or shortness of breath on drive please pull over and call 911      Relevant Orders   EKG 12-Lead (Completed)    No orders of the defined types were placed in this encounter.   Follow-up: No follow-ups on file.    Mort Sawyers, FNP

## 2021-04-13 NOTE — Assessment & Plan Note (Addendum)
ekg in office NSR  However pt is physical pain with tearful eyes when episode of chest pain occurs in exam room.  Unable to appropriately determine if this is non-STEMI versus moderate to severe heartburn. Patient refuses EMS patient stable at current and advised to go to the emergency room [facility] for triage If any worsening chest pain or shortness of breath on drive please pull over and call 911  Patient sent to the emergency room as this if heart attack could be life-threatening

## 2021-04-13 NOTE — Progress Notes (Signed)
NSR, however pt physical presentation with chest pain, ongoing unable to r/o if acute MI or heartburn. Sent to ER, declined EMS, pt stable upon leaving.

## 2021-04-13 NOTE — ED Notes (Signed)
40 yof c/c of slightly to the left midline chest pain. The pt advised the pain is worse when she bends forward or lays down. The pt advised her pain is at a 7. No previous cardiac hx.

## 2021-04-14 ENCOUNTER — Encounter: Payer: Self-pay | Admitting: Family

## 2021-04-15 ENCOUNTER — Encounter: Payer: Self-pay | Admitting: Family

## 2021-04-15 ENCOUNTER — Telehealth: Payer: Self-pay

## 2021-04-15 ENCOUNTER — Other Ambulatory Visit: Payer: Self-pay

## 2021-04-15 ENCOUNTER — Ambulatory Visit
Admission: RE | Admit: 2021-04-15 | Discharge: 2021-04-15 | Disposition: A | Discharge status: Home / Self Care | Payer: BC Managed Care – PPO | Source: Ambulatory Visit | Attending: Family | Admitting: Family

## 2021-04-15 ENCOUNTER — Ambulatory Visit (INDEPENDENT_AMBULATORY_CARE_PROVIDER_SITE_OTHER): Payer: BC Managed Care – PPO | Admitting: Family

## 2021-04-15 VITALS — BP 134/80 | HR 96 | Ht 66.0 in | Wt 181.0 lb

## 2021-04-15 DIAGNOSIS — R112 Nausea with vomiting, unspecified: Secondary | ICD-10-CM

## 2021-04-15 DIAGNOSIS — R1011 Right upper quadrant pain: Secondary | ICD-10-CM | POA: Diagnosis not present

## 2021-04-15 DIAGNOSIS — R8290 Unspecified abnormal findings in urine: Secondary | ICD-10-CM | POA: Insufficient documentation

## 2021-04-15 DIAGNOSIS — R1012 Left upper quadrant pain: Secondary | ICD-10-CM

## 2021-04-15 DIAGNOSIS — K5900 Constipation, unspecified: Secondary | ICD-10-CM | POA: Insufficient documentation

## 2021-04-15 HISTORY — DX: Nausea with vomiting, unspecified: R11.2

## 2021-04-15 HISTORY — DX: Right upper quadrant pain: R10.11

## 2021-04-15 HISTORY — DX: Left upper quadrant pain: R10.12

## 2021-04-15 MED ORDER — IOHEXOL 300 MG/ML  SOLN
100.0000 mL | Freq: Once | INTRAMUSCULAR | Status: AC | PRN
  Administered 2021-04-15: 100 mL via INTRAVENOUS

## 2021-04-15 NOTE — Telephone Encounter (Signed)
Meghan Jones with [facility] CT dept called report for CT abd and pelvis. Pt is waiting. Report is in Epic; I called Meghan Jones and notified her that pt was waiting and impression of CT abd and pelvis was no acute findings within the abdomen or pelvis; status post appendectomy and hysterectomy. Meghan Jones said pt could go and Meghan Jones will my chart pt later today. Meghan Jones voiced understanding and will let pt go home. Sending note to Meghan Pedro FNP. ?

## 2021-04-15 NOTE — Telephone Encounter (Signed)
Saw patient in office today please review note ?

## 2021-04-15 NOTE — Assessment & Plan Note (Addendum)
Advised pt really should go to ER however pt refusing to go right now as she was just there,  ?Stat ct abd pelvis , suspected pancreatitis from elevated lipase ?Tramadol continue pantoprazole continue  ?If any worsening needs to go ER immediately  ?R/o cholecystitis, small bowel obstruction ?She did state she will go to ER If vomit is to reoccur, inability to avoid, or any increase in pain even slight.  ?

## 2021-04-15 NOTE — Assessment & Plan Note (Signed)
R/o obstruction ?

## 2021-04-15 NOTE — Patient Instructions (Addendum)
If any worsening pain or increase throwing up, please go to the  emergency room immediately.  ? ?For your stat scan,  ? ?ARRIVE tomorrow 04/16/2021  8:00am - [facility] Entrance - check in Radiology NPO after 12am Needs to go by and pick up contrast bottles today [facility] the contrast bottles so that she doesnt have to deal with Hospital parking and walking through the hospital -- [address] [employer] ? ?It was a pleasure seeing you today! Please do not hesitate to reach out with any questions and or concerns. ? ?Regards,  ? ?Harshal Sirmon ?FNP-C ? ?

## 2021-04-15 NOTE — Assessment & Plan Note (Signed)
Urine culture poct today pending ? ?

## 2021-04-15 NOTE — Progress Notes (Signed)
Established Patient Office Visit  Subjective:  Patient ID: Meghan Jones, female    DOB: 24-Jul-1980  Age: 41 y.o. MRN: [id number]  CC:  Chief Complaint  Patient presents with   Chest Pain    Pt stated--chest pain/stabbing radiating around the back. Pt went to urgent care Tuesday.    HPI Meghan Jones is here for ER f/u and concerns  Not admitted, went to [facility] ER.  Discharge medications:tramadol 50 mg q6h prn and protonix 40 mg bid , given toradol inpatient Discharge DX: mild pancreatitis and gerd   Acute concerns: still with sharp stabbing pain ongoing, about every two minutes. Suspected pancreatitis now radiating around her back. Threw up one time last night. Only food she has had since was a plain homemade biscuit which she doesn't feel she tolerated well. Had a few crackers prior to coming here. Hurts when she eats. Did start pantroprazole without much relief. Has not taken pain medication last night but still no relief of symptoms. Monday night had diarrhea.   While in ER EKG nonischemic  CXR with no acute abn Troponins x 2, d dimer unremarkable Slight elevated lipase U/a abd no acute concerns   She is peeing but very cloudy.   Past Medical History:  Diagnosis Date   Allergy    Constipation    Controlled with Shakeology through [facility]   Depression    Mild    Past Surgical History:  Procedure Laterality Date   ABDOMINAL HYSTERECTOMY  2011   Asherman's Syndrome   APPENDECTOMY  2000    Family History  Problem Relation Age of Onset   Hyperlipidemia Maternal Grandfather    Hyperlipidemia Paternal Grandmother    Heart disease Paternal Grandmother 4       CAD - Died MI   Mental illness Paternal Grandmother    Hypertension Paternal Grandmother    Depression Paternal Grandmother        Suicide attempt x 3   Obesity Paternal Grandmother    Diabetes Paternal Grandmother    Hyperlipidemia Paternal Grandfather    Obesity Paternal Grandfather     Hypertension Paternal Grandfather    Diabetes Paternal Grandfather    Depression Sister    Breast cancer Neg Hx     Social History   Socioeconomic History   Marital status: Married    Spouse name: Meghan Jones   Number of children: 1   Years of education: 18   Highest education level: Not on file  Occupational History   Occupation: Mining engineer: [employer]    Comment: [employer]  Tobacco Use   Smoking status: Never   Smokeless tobacco: Never  Substance and Sexual Activity   Alcohol use: Yes    Alcohol/week: 2.0 standard drinks    Types: 1 Glasses of wine, 1 Shots of liquor per week    Comment: Occassionally 2 drinks weekly   Drug use: No   Sexual activity: Yes    Birth control/protection: Surgical  Other Topics Concern   Not on file  Social History Narrative   Tiaa grew up in [place], [place].    She attended [address] [employer] [place] in [place] ([employer]) and obtained her bachelors in Psychologist, sport and exercise.    She then attended [employer] and obtained her Masters in [employer].    She lives at home with her husband Meghan Jones) and daughter Meghan Jones).    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     Social Determinants  of Health   Financial Resource Strain: Not on file  Food Insecurity: Not on file  Transportation Needs: Not on file  Physical Activity: Not on file  Stress: Not on file  Social Connections: Not on file  Intimate Partner Violence: Not on file    Outpatient Medications Prior to Visit  Medication Sig Dispense Refill   cetirizine (ZYRTEC) 10 MG tablet Take 1 tablet (10 mg total) by mouth daily. 90 tablet 3   fluticasone (FLONASE) 50 MCG/ACT nasal spray Place 1 spray into both nostrils 2 (two) times daily as needed for allergies or rhinitis. 48 g 0   montelukast (SINGULAIR) 10 MG tablet Take 1 tablet (10 mg total) by mouth at bedtime. For allergies. 90 tablet 0   Multiple Vitamin (MULTIVITAMIN) tablet Take 1 tablet  by mouth daily. Reported on 06/12/2015     pantoprazole (PROTONIX) 40 MG tablet Take 1 tablet (40 mg total) by mouth 2 (two) times daily. 60 tablet 0   rizatriptan (MAXALT) 10 MG tablet Take 1 tablet by mouth at migraine onset. May repeat in 2 hours if needed 10 tablet 0   Semaglutide-Weight Management (WEGOVY) 0.5 MG/0.5ML SOAJ Inject 0.5 mg into the skin once a week. 2 mL 1   traMADol (ULTRAM) 50 MG tablet Take 1 tablet (50 mg total) by mouth every 6 (six) hours as needed. 15 tablet 0   escitalopram (LEXAPRO) 10 MG tablet Take 1 tablet (10 mg total) by mouth daily. For anxiety. 30 tablet 0   No facility-administered medications prior to visit.    No Known Allergies  ROS Review of Systems  Constitutional:  Negative for chills, fatigue and fever.  Respiratory:  Positive for chest tightness (left sided). Negative for cough and shortness of breath.   Cardiovascular:  Negative for chest pain and leg swelling.  Gastrointestinal:  Positive for abdominal pain (left upper abd), constipation, nausea and vomiting. Negative for abdominal distention and diarrhea.  Genitourinary:  Negative for difficulty urinating.  Psychiatric/Behavioral:  Negative for agitation and sleep disturbance.   All other systems reviewed and are negative.      Objective:    Physical Exam Vitals reviewed.  Constitutional:      General: She is not in acute distress.    Appearance: Normal appearance. She is well-developed. She is obese. She is ill-appearing. She is not toxic-appearing or diaphoretic.  HENT:     Right Ear: Tympanic membrane normal.     Left Ear: Tympanic membrane normal.     Mouth/Throat:     Mouth: Mucous membranes are moist.     Pharynx: No pharyngeal swelling.     Tonsils: No tonsillar exudate.  Eyes:     Extraocular Movements: Extraocular movements intact.     Conjunctiva/sclera: Conjunctivae normal.     Pupils: Pupils are equal, round, and reactive to light.  Neck:     Thyroid: No thyroid  mass.  Cardiovascular:     Rate and Rhythm: Normal rate and regular rhythm.  Pulmonary:     Effort: Pulmonary effort is normal.     Breath sounds: Normal breath sounds.  Abdominal:     General: Abdomen is flat. Bowel sounds are decreased.     Palpations: Abdomen is soft. There is no mass.     Tenderness: There is abdominal tenderness in the right upper quadrant, epigastric area and left upper quadrant. There is guarding. There is no rebound. Positive signs include Murphy's sign. Negative signs include psoas sign.     Hernia: No  hernia is present.  Musculoskeletal:        General: Normal range of motion.  Lymphadenopathy:     Cervical:     Right cervical: No superficial cervical adenopathy.    Left cervical: No superficial cervical adenopathy.  Skin:    General: Skin is warm.     Capillary Refill: Capillary refill takes less than 2 seconds.  Neurological:     General: No focal deficit present.     Mental Status: She is alert and oriented to person, place, and time.  Psychiatric:        Mood and Affect: Mood normal.        Behavior: Behavior normal.        Thought Content: Thought content normal.        Judgment: Judgment normal.    BP 134/80    Pulse 96    Ht 5\' 6"  (1.676 m)    Wt 181 lb (82.1 kg)    SpO2 98%    BMI 29.21 kg/m  Wt Readings from Last 3 Encounters:  04/15/21 181 lb (82.1 kg)  04/13/21 183 lb (83 kg)  04/13/21 188 lb (85.3 kg)     Health Maintenance Due  Topic Date Due   COVID-19 Vaccine (4 - Booster for Pfizer series) 02/14/2020    There are no preventive care reminders to display for this patient.  Lab Results  Component Value Date   TSH 1.27 05/22/2020   Lab Results  Component Value Date   WBC 9.8 04/13/2021   HGB 14.3 04/13/2021   HCT 41.7 04/13/2021   MCV 84.9 04/13/2021   PLT 367 04/13/2021   Lab Results  Component Value Date   NA 139 04/13/2021   K 3.7 04/13/2021   CO2 28 04/13/2021   GLUCOSE 94 04/13/2021   BUN 12 04/13/2021    CREATININE 0.92 04/13/2021   BILITOT 0.6 04/13/2021   ALKPHOS 49 04/13/2021   AST 18 04/13/2021   ALT 14 04/13/2021   PROT 7.3 04/13/2021   ALBUMIN 4.6 04/13/2021   CALCIUM 9.8 04/13/2021   ANIONGAP 7 04/13/2021   GFR 77.38 01/12/2021   Lab Results  Component Value Date   CHOL 188 09/01/2020   Lab Results  Component Value Date   HDL 66.90 09/01/2020   Lab Results  Component Value Date   LDLCALC 110 (H) 09/01/2020   Lab Results  Component Value Date   TRIG 57.0 09/01/2020   Lab Results  Component Value Date   CHOLHDL 3 09/01/2020   Lab Results  Component Value Date   HGBA1C 5.2 01/12/2021      Assessment & Plan:   Problem List Items Addressed This Visit       Digestive   Nausea and vomiting    R/o obstruction      Relevant Orders   CT Abdomen Pelvis W Contrast     Other   Cloudy urine    Urine culture poct today pending       Relevant Orders   Urine Culture   POCT urinalysis dipstick   Abdominal pain, right upper quadrant    Stat ct abd pelvis  R/o pancreatitis Tramadol continue pantoprazole continue  If any worsening needs to go ER immediately  R/o cholecystitis, small bowel obstruction      Relevant Orders   CT Abdomen Pelvis W Contrast   Abdominal pain, left upper quadrant    Stat ct abd pelvis , suspected pancreatitis from elevated lipase Tramadol continue pantoprazole continue  If any worsening needs to go ER immediately  R/o cholecystitis, small bowel obstruction      Relevant Orders   CT Abdomen Pelvis W Contrast   Constipation - Primary    Ct abd pelvis r/o obstruction , stat      Relevant Orders   CT Abdomen Pelvis W Contrast    No orders of the defined types were placed in this encounter.   Follow-up: No follow-ups on file.    Mort Sawyersabitha Lylith Bebeau, FNP

## 2021-04-15 NOTE — Assessment & Plan Note (Signed)
Stat ct abd pelvis  ?R/o pancreatitis ?Tramadol continue pantoprazole continue  ?If any worsening needs to go ER immediately  ?R/o cholecystitis, small bowel obstruction ?

## 2021-04-15 NOTE — Telephone Encounter (Signed)
Received call from [facility] , report read to me verbally.  ?Pt ok to leave [facility].  ?Called pt and notified her of results. Pt doing better than earlier today, pain slightly better, denies needs for zofran  ?

## 2021-04-15 NOTE — Assessment & Plan Note (Signed)
Ct abd pelvis r/o obstruction , stat ?

## 2021-04-16 ENCOUNTER — Ambulatory Visit: Admission: RE | Admit: 2021-04-16 | Payer: BC Managed Care – PPO | Source: Ambulatory Visit

## 2021-04-16 ENCOUNTER — Encounter: Payer: Self-pay | Admitting: Family

## 2021-04-22 ENCOUNTER — Encounter: Payer: Self-pay | Admitting: Primary Care

## 2021-04-22 ENCOUNTER — Ambulatory Visit: Payer: BC Managed Care – PPO | Admitting: Primary Care

## 2021-04-22 ENCOUNTER — Other Ambulatory Visit: Payer: Self-pay

## 2021-04-22 VITALS — BP 110/80 | HR 79 | Temp 98.2°F | Resp 16 | Ht 66.0 in | Wt 184.2 lb

## 2021-04-22 DIAGNOSIS — R748 Abnormal levels of other serum enzymes: Secondary | ICD-10-CM

## 2021-04-22 DIAGNOSIS — R1084 Generalized abdominal pain: Secondary | ICD-10-CM | POA: Diagnosis not present

## 2021-04-22 DIAGNOSIS — F419 Anxiety disorder, unspecified: Secondary | ICD-10-CM | POA: Diagnosis not present

## 2021-04-22 DIAGNOSIS — R682 Dry mouth, unspecified: Secondary | ICD-10-CM | POA: Diagnosis not present

## 2021-04-22 DIAGNOSIS — F32A Depression, unspecified: Secondary | ICD-10-CM

## 2021-04-22 HISTORY — DX: Abnormal levels of other serum enzymes: R74.8

## 2021-04-22 NOTE — Assessment & Plan Note (Addendum)
Strongly suspect Wegovy (GLP-1 antagonist) contributed to most if not all of her symptoms. ? ?Pancreatitis and GERD are well-known side effects of medications in this class. ? ?Pain and symptoms have resolved as of today.  Continue to closely monitor.  Repeat lipase pending. ? ?Discontinue [id number]. ?Discussed weaning instructions from pantoprazole 40 mg twice daily. ? ?Office notes and emergency department notes, labs, imaging reviewed ?

## 2021-04-22 NOTE — Assessment & Plan Note (Signed)
Strongly suspect that most, if not all, her recent symptoms (including the lipase elevation) was secondary to [facility]. ? ?GLP-1 antagonists are well-known to cause pancreatitis and GERD. ? ?We discontinued Wegovy.  She has been off for an entire week.  Repeat lipase pending today. ? ?Hospital notes, labs, imaging reviewed. ?

## 2021-04-22 NOTE — Progress Notes (Signed)
Subjective:    Patient ID: Meghan Jones, female    DOB: Aug 21, 1980, 41 y.o.   MRN: [id number]  HPI  Meghan Jones is a very pleasant 41 y.o. female with a history of migraines, anxiety depression, frequent headaches, obesity who presents today for follow-up of obesity, ED follow-up, and to discuss ongoing dry mouth.  1) Obesity: Currently managed on Wegovy 0.5 mg weekly which was initiated in late November 2022. She did not start her Wegovy until mid January 2023.  Since her last visit she's lost 10 pounds. She's noticed upper abdominal pain, increased esophageal reflux, belching.  See notes below.  BP Readings from Last 3 Encounters:  04/22/21 110/80  04/15/21 134/80  04/13/21 120/79   Wt Readings from Last 3 Encounters:  04/22/21 184 lb 3.2 oz (83.6 kg)  04/15/21 181 lb (82.1 kg)  04/13/21 183 lb (83 kg)     2) Pancreatitis/Chest Pain: Originally evaluated by Meghan Darussalam NP on 04/13/2021 for 5-day history of symptoms of chest pain, worse with eating, mild shortness of breath.  EKG was grossly normal, however patient was tearful and in a lot of pain so she was sent to the emergency department.  She presented to [facility] ED that afternoon.  She underwent chest x-ray which was negative; abdominal ultrasound which was negative for gallstones or other acute abnormality.  Labs for troponins and D-dimer were negative.  Lipase was elevated.  Symptoms improved after IV Pepcid.  She was suspected to have mild pancreatitis of unknown etiology.  She was discharged home later that evening with a prescription for pantoprazole 40 mg twice daily.  She was reevaluated by Tabatha NP on 04/15/2021 for continued sharp, stabbing pain with radiation around her back, vomiting, pain with eating.  CT abdomen pelvis was ordered which was without acute findings.  She was prescribed tramadol and advised to continue pantoprazole.   Today she's feeling better. She denies abdominal pain. She had been taking Tramadol  routinely, pantoprazole 40 mg BID. Her last episode of pain was last night, left upper quadrant pain with radiation to her back.   Her last dose of Wegovy was one week ago. She denies fevers, vomiting.   3) Dry Mouth/Tongue: Only felt to the tongue. Chronic since November 2022. Initially Zoloft was suspected to be contributing so it was discontinued, she's noticed no improvement in dry tongue after discontinuation.  She wakes up every morning without a dry mouth. In the afternoon and evening her symptoms begin with extra saliva in her mouth, lasts for about 1 hour, cheeks will hurt as she sucks them due to so much saliva. She then will notice very dry tongue, feels like a desert. This occurs daily.   She denies swelling to the inside of her cheeks. She is sucking on sugar free hard candy throughout the day.  She is tried numerous over-the-counter treatments for dry mouth without improvement.  She's seen her dentist and her dry tongue, was told that it was likely her Zoloft contributing.  She denies family history of Sjogren's Disease or any other autoimmune disease.  4) Anxiety and Depression:Previously managed on sertraline 50 mg, felt well managed on this regimen, but this was discontinued several months ago due to symptoms of dry tongue/mouth.  Since discontinuation of Zoloft she has noticed her anxiety has increased, she has noticed no improvement to her dry tongue. She would like to resume her Zoloft.   Review of Systems  HENT:  Negative for facial swelling and sinus  pressure.        Dry mouth   Gastrointestinal:  Positive for abdominal pain. Negative for diarrhea, nausea and vomiting.  Psychiatric/Behavioral:  The patient is nervous/anxious.         Past Medical History:  Diagnosis Date   Allergy    Constipation    [facility]   Depression    Mild    Social History   Socioeconomic History   Marital status: Married    Spouse name: Meghan Brassick    Number of children: 1   Years of education: 18   Highest education level: Not on file  Occupational History   Occupation: Mining engineerpeech Therapist    Employer: [employer]    Comment: [employer]er, [employer]  Tobacco Use   Smoking status: Never   Smokeless tobacco: Never  Substance and Sexual Activity   Alcohol use: Yes    Alcohol/week: 2.0 standard drinks    Types: 1 Glasses of wine, 1 Shots of liquor per week    Comment: Occassionally 2 drinks weekly   Drug use: No   Sexual activity: Yes    Birth control/protection: Surgical  Other Topics Concern   Not on file  Social History Narrative   Meghan Bridge[place]artha grew up [place], [place].    She [employer] [place] [place] and obtained her bachelors in Psychologist, sport and exercisepeech Pathology.    She then attended [employer] and obtained her Masters in Applied [employer]ech Pathology.    She lives at home with her husband Meghan Jones(Meghan Jones) and daughter Meghan Jones(Meghan Jones).    She enjoys working out [employer] fast streamerand photography.     Social Determinants of Health   Financial Resource Strain: Not on file  Food Insecurity: Not on file  Transportation Needs: Not on file  Physical Activity: Not on file  Stress: Not on file  Social Connections: Not on file  Intimate Partner Violence: Not on file    Past Surgical History:  Procedure Laterality Date   ABDOMINAL HYSTERECTOMY  2011   Asherman's Syndrome   APPENDECTOMY  2000    Family History  Problem Relation Age of Onset   Hyperlipidemia Maternal Grandfather    Hyperlipidemia Paternal Grandmother    Heart disease Paternal Grandmother [id number]       CAD - Died MI   Mental illness Paternal Grandmother    Hypertension Paternal Grandmother    Depression Paternal Grandmother        Suicide attempt x 3   Obesity Paternal Grandmother    Diabetes Paternal Grandmother    Hyperlipidemia Paternal Grandfather    Obesity Paternal Grandfather    Hypertension Paternal Grandfather    Diabetes Paternal Grandfather    Depression  Sister    Breast cancer Neg Hx     No Known Allergies  Current Outpatient Medications on File Prior to Visit  Medication Sig Dispense Refill   cetirizine (ZYRTEC) 10 MG tablet Take 1 tablet (10 mg total) by mouth daily. 90 tablet 3   fluticasone (FLONASE) 50 MCG/ACT nasal spray Place 1 spray into both nostrils 2 (two) times daily as needed for allergies or rhinitis. 48 g 0   montelukast (SINGULAIR) 10 MG tablet Take 1 tablet (10 mg total) by mouth at bedtime. For allergies. 90 tablet 0   Multiple Vitamin (MULTIVITAMIN) tablet Take 1 tablet by mouth daily. Reported on 06/12/2015     pantoprazole (PROTONIX) 40 MG tablet Take 1 tablet (40 mg total) by mouth 2 (two) times daily. 60 tablet 0  rizatriptan (MAXALT) 10 MG tablet Take 1 tablet by mouth at migraine onset. May repeat in 2 hours if needed 10 tablet 0   sertraline (ZOLOFT) 50 MG tablet Take 50 mg by mouth daily.     traMADol (ULTRAM) 50 MG tablet Take 1 tablet (50 mg total) by mouth every 6 (six) hours as needed. 15 tablet 0   No current facility-administered medications on file prior to visit.    BP 110/80 (BP Location: Left Arm, Patient Position: Sitting, Cuff Size: Normal)    Pulse 79    Temp 98.2 F (36.8 C) (Oral)    Resp 16    Ht 5\' 6"  (1.676 m)    Wt 184 lb 3.2 oz (83.6 kg)    SpO2 98%    BMI 29.73 kg/m  Objective:   Physical Exam HENT:     Mouth/Throat:     Mouth: Mucous membranes are moist.     Pharynx: No posterior oropharyngeal erythema.     Comments: Entire oral cavity appears to be moist, including her tongue. No discoloration to oral mucosa. Stensen's ducts appear to be patent. No lesions. Cardiovascular:     Rate and Rhythm: Normal rate and regular rhythm.  Pulmonary:     Effort: Pulmonary effort is normal.     Breath sounds: Normal breath sounds.  Abdominal:     General: Bowel sounds are normal.     Palpations: Abdomen is soft.     Tenderness: There is no abdominal tenderness.  Musculoskeletal:      Cervical back: Neck supple.  Skin:    General: Skin is warm and dry.  Psychiatric:        Mood and Affect: Mood normal.          Assessment & Plan:      This visit occurred during the SARS-CoV-2 public health emergency.  Safety protocols were in place, including screening questions prior to the visit, additional usage of staff PPE, and extensive cleaning of exam room while observing appropriate contact time as indicated for disinfecting solutions.

## 2021-04-22 NOTE — Assessment & Plan Note (Signed)
Interestingly on exam her entire oral mucosa is moist with plenty of saliva, including her tongue. ? ?Unclear cause for her symptoms, Zoloft was not contributing. ? ?Referral placed to ENT for further evaluation. ?

## 2021-04-22 NOTE — Patient Instructions (Signed)
Okay to resume your Zoloft 50 mg. ? ?Wean off of pantoprazole.  Reduce to 1 tablet once daily for one week, then stop. Notify me if you have rebound symptoms.  ? ?We will discontinue your [phone]. ? ?You will be contacted regarding your referral to ENT.  Please let us know if you have not been contacted within two weeks.  ? ?It was a pleasure to see you today! ? ?

## 2021-04-22 NOTE — Assessment & Plan Note (Signed)
Resume Zoloft mg daily. ?She has plenty of medication at home and will notify when she needs a refill. ? ?

## 2021-04-23 LAB — TSH: TSH: 1.91 u[IU]/mL (ref 0.35–5.50)

## 2021-04-23 LAB — LIPASE: Lipase: 28 U/L (ref 11.0–59.0)

## 2021-04-27 ENCOUNTER — Encounter: Payer: Self-pay | Admitting: *Deleted

## 2021-05-11 NOTE — Telephone Encounter (Signed)
Meghan Ohm, do you mind taking a look at this patient's most recent ECG from the ED?   ? ?She originally presented to the ED with palpitations, chest tightness, upper abdominal pain.  She denied deduced that her symptoms were secondary to initiation of Wegovy. Her symptoms have since abated since discontinuation.  ?

## 2021-05-12 NOTE — Telephone Encounter (Signed)
Thanks, Chris 

## 2021-05-13 ENCOUNTER — Ambulatory Visit: Payer: BC Managed Care – PPO | Admitting: Gastroenterology

## 2021-06-10 ENCOUNTER — Telehealth: Payer: BC Managed Care – PPO | Admitting: Physician Assistant

## 2021-06-10 DIAGNOSIS — J208 Acute bronchitis due to other specified organisms: Secondary | ICD-10-CM

## 2021-06-10 MED ORDER — BENZONATATE 100 MG PO CAPS
100.0000 mg | ORAL_CAPSULE | Freq: Three times a day (TID) | ORAL | 0 refills | Status: DC | PRN
Start: 2021-06-10 — End: 2021-09-09

## 2021-06-10 NOTE — Progress Notes (Signed)
I have spent 5 minutes in review of e-visit questionnaire, review and updating patient chart, medical decision making and response to patient.   Nataki Mccrumb Cody Errin Whitelaw, PA-C    

## 2021-06-10 NOTE — Progress Notes (Signed)
We are sorry that you are not feeling well.  Here is how we plan to help! ? ?Based on your presentation I believe you most likely have A cough due to a virus.  This is called viral bronchitis and is best treated by rest, plenty of fluids and control of the cough.  You may use Ibuprofen or Tylenol as directed to help your symptoms.   ?  ?In addition you may use A prescription cough medication called Tessalon Perles 100mg . You may take 1-2 capsules every 8 hours as needed for your cough. I have sent in a prescription to the pharmacy for you to take as directed. I also recommend starting OTC Flonase to help with nasal congestion and ear pressure.  ? ?From your responses in the eVisit questionnaire you describe inflammation in the upper respiratory tract which is causing a significant cough.  This is commonly called Bronchitis and has four common causes:   ?Allergies ?Viral Infections ?Acid Reflux ?Bacterial Infection ?Allergies, viruses and acid reflux are treated by controlling symptoms or eliminating the cause. An example might be a cough caused by taking certain blood pressure medications. You stop the cough by changing the medication. Another example might be a cough caused by acid reflux. Controlling the reflux helps control the cough. ? ?USE OF BRONCHODILATOR ("RESCUE") INHALERS: ?There is a risk from using your bronchodilator too frequently.  The risk is that over-reliance on a medication which only relaxes the muscles surrounding the breathing tubes can reduce the effectiveness of medications prescribed to reduce swelling and congestion of the tubes themselves.  Although you feel brief relief from the bronchodilator inhaler, your asthma may actually be worsening with the tubes becoming more swollen and filled with mucus.  This can delay other crucial treatments, such as oral steroid medications. If you need to use a bronchodilator inhaler daily, several times per day, you should discuss this with your provider.   There are probably better treatments that could be used to keep your asthma under control.  ?   ?HOME CARE ?Only take medications as instructed by your medical team. ?Complete the entire course of an antibiotic. ?Drink plenty of fluids and get plenty of rest. ?Avoid close contacts especially the very young and the elderly ?Cover your mouth if you cough or cough into your sleeve. ?Always remember to wash your hands ?A steam or ultrasonic humidifier can help congestion.  ? ?GET HELP RIGHT AWAY IF: ?You develop worsening fever. ?You become short of breath ?You cough up blood. ?Your symptoms persist after you have completed your treatment plan ?MAKE SURE YOU  ?Understand these instructions. ?Will watch your condition. ?Will get help right away if you are not doing well or get worse. ?  ? ?Thank you for choosing an e-visit. ? ?Your e-visit answers were reviewed by a board certified advanced clinical practitioner to complete your personal care plan. Depending upon the condition, your plan could have included both over the counter or prescription medications. ? ?Please review your pharmacy choice. Make sure the pharmacy is open so you can pick up prescription now. If there is a problem, you may contact your provider through and have the prescription routed to another pharmacy.  Your safety is important to [employer]. If you have drug allergies check your prescription carefully.  ? ?For the next 24 hours you can use MyChart to ask questions about today's visit, request a non-urgent call back, or ask for a work or school excuse. ?You will get  an email in the next two days asking about your experience. I hope that your e-visit has been valuable and will speed your recovery. ? ?

## 2021-06-14 ENCOUNTER — Ambulatory Visit: Payer: BC Managed Care – PPO | Admitting: Gastroenterology

## 2021-07-08 DIAGNOSIS — R682 Dry mouth, unspecified: Secondary | ICD-10-CM

## 2021-07-09 MED ORDER — NYSTATIN 100000 UNIT/ML MT SUSP
5.0000 mL | Freq: Four times a day (QID) | OROMUCOSAL | 0 refills | Status: DC
Start: 2021-07-09 — End: 2021-09-09

## 2021-07-19 ENCOUNTER — Other Ambulatory Visit: Payer: Self-pay | Admitting: Primary Care

## 2021-07-19 DIAGNOSIS — J302 Other seasonal allergic rhinitis: Secondary | ICD-10-CM

## 2021-07-20 NOTE — Telephone Encounter (Signed)
Patient due for CPE in mid to late July. Please schedule.

## 2021-07-20 NOTE — Telephone Encounter (Signed)
Called patient appointment made for CPE  ?

## 2021-08-17 ENCOUNTER — Telehealth: Payer: BC Managed Care – PPO | Admitting: Nurse Practitioner

## 2021-08-17 DIAGNOSIS — B370 Candidal stomatitis: Secondary | ICD-10-CM

## 2021-08-18 MED ORDER — NYSTATIN 100000 UNIT/ML MT SUSP: 5.0000 mL | Freq: Four times a day (QID) | OROMUCOSAL | 0 refills | Status: DC

## 2021-08-18 NOTE — Progress Notes (Signed)
E-Visit for Mouth Ulcers  We are sorry that you are not feeling well.  Here is how we plan to help!  Based on what you have shared with me, it appears that you do have mouth ulcer(s). Possibly secondary to oral thrush. After reviewing your chart I see you were given Nystatin in May. We can refill this one time, I would suggest follow up with ENT as your primary care suggested for chronic management and workup.   Meds ordered this encounter  Medications   nystatin (MYCOSTATIN) 100000 UNIT/ML suspension    Sig: Take 5 mLs (500,000 Units total) by mouth 4 (four) times daily.    Dispense:  60 mL    Refill:  0     While the exact causes are unknown, some common causes and factors that may aggravate mouth ulcers include: Genetics - Sometimes mouth ulcers run in families High alcohol intake Acidic foods such as citrus fruits like pineapple, grapefruit, orange fruits/juices, may aggravate mouth ulcers Other foods high in acidity or spice such as coffee, chocolate, chips, pretzels, eggs, nuts, cheese Quitting smoking Injury caused by biting the tongue or inside of the cheek Diet lacking in B-12, zinc, folic acid or iron Female hormone shifts with menstruation Excessive fatigue, emotional stress or anxiety Prevention: Talk to your doctor if you are taking meds that are known to cause mouth ulcers such as:   Anti-inflammatory drugs (for example Ibuprofen, Naproxen sodium), pain killers, Beta blockers, Oral nicotine replacement drugs, Some street drugs (heroin).   Avoid allowing any tablets to dissolve in your mouth that are meant to swallowed whole Avoid foods/drinks that trigger or worsen symptoms Keep your mouth clean with daily brushing and flossing  Home Care: The goal with treatment is to ease the pain where ulcers occur and help them heal as quickly as possible.  There is no medical treatment to prevent mouth ulcers from coming back or recurring.  Avoid spicy and acidic foods Eat soft  foods and avoid rough, crunchy foods Avoid chewing gum Do not use toothpaste that contains sodium lauryl sulphite Use a straw to drink which helps avoid liquids toughing the ulcers near the front of your mouth Use a very soft toothbrush If you have dentures or dental hardware that you feel is not fitting well or contributing to his, please see your dentist. Use saltwater mouthwash which helps healing. Dissolve a  teaspoon of salt in a glass of warm water. Swish around your mouth and spit it out. This can be used as needed if it is soothing.   GET HELP RIGHT AWAY IF: Persistent ulcers require checking IN PERSON (face to face). Any mouth lesion lasting longer than a month should be seen by your DENTIST as soon as possible for evaluation for possible oral cancer. If you have a non-painful ulcer in 1 or more areas of your mouth Ulcers that are spreading, are very large or particularly painful Ulcers last longer than one week without improving on treatment If you develop a fever, swollen glands and begin to feel unwell Ulcers that developed after starting a new medication MAKE SURE YOU: Understand these instructions. Will watch your condition. Will get help right away if you are not doing well or get worse.  Thank you for choosing an e-visit.  Your e-visit answers were reviewed by a board certified advanced clinical practitioner to complete your personal care plan. Depending upon the condition, your plan could have included both over the counter or prescription medications.  Please  review your pharmacy choice. Make sure the pharmacy is open so you can pick up prescription now. If there is a problem, you may contact your provider through [employer] and have the prescription routed to another pharmacy.  Your safety is important to [place]. If you have drug allergies check your prescription carefully.   For the next 24 hours you can use MyChart to ask questions about today's visit, request a  non-urgent call back, or ask for a work or school excuse. You will get an email in the next two days asking about your experience. I hope that your e-visit has been valuable and will speed your recovery.   I spent approximately 7 minutes reviewing the patient's history, current symptoms and coordinating their plan of care today.    Meds ordered this encounter  Medications   nystatin (MYCOSTATIN) 100000 UNIT/ML suspension    Sig: Take 5 mLs (500,000 Units total) by mouth 4 (four) times daily.    Dispense:  60 mL    Refill:  0

## 2021-09-09 ENCOUNTER — Encounter: Payer: Self-pay | Admitting: Primary Care

## 2021-09-09 ENCOUNTER — Ambulatory Visit (INDEPENDENT_AMBULATORY_CARE_PROVIDER_SITE_OTHER): Payer: BC Managed Care – PPO | Admitting: Primary Care

## 2021-09-09 VITALS — BP 110/80 | HR 61 | Temp 98.6°F | Ht 66.0 in | Wt 196.0 lb

## 2021-09-09 DIAGNOSIS — J302 Other seasonal allergic rhinitis: Secondary | ICD-10-CM | POA: Diagnosis not present

## 2021-09-09 DIAGNOSIS — N6310 Unspecified lump in the right breast, unspecified quadrant: Secondary | ICD-10-CM

## 2021-09-09 DIAGNOSIS — E669 Obesity, unspecified: Secondary | ICD-10-CM

## 2021-09-09 DIAGNOSIS — G43909 Migraine, unspecified, not intractable, without status migrainosus: Secondary | ICD-10-CM

## 2021-09-09 DIAGNOSIS — Z1231 Encounter for screening mammogram for malignant neoplasm of breast: Secondary | ICD-10-CM

## 2021-09-09 DIAGNOSIS — Z0000 Encounter for general adult medical examination without abnormal findings: Secondary | ICD-10-CM | POA: Diagnosis not present

## 2021-09-09 DIAGNOSIS — G43709 Chronic migraine without aura, not intractable, without status migrainosus: Secondary | ICD-10-CM

## 2021-09-09 DIAGNOSIS — N289 Disorder of kidney and ureter, unspecified: Secondary | ICD-10-CM

## 2021-09-09 DIAGNOSIS — R519 Headache, unspecified: Secondary | ICD-10-CM

## 2021-09-09 DIAGNOSIS — F32A Depression, unspecified: Secondary | ICD-10-CM

## 2021-09-09 DIAGNOSIS — F419 Anxiety disorder, unspecified: Secondary | ICD-10-CM

## 2021-09-09 DIAGNOSIS — K5900 Constipation, unspecified: Secondary | ICD-10-CM

## 2021-09-09 DIAGNOSIS — R682 Dry mouth, unspecified: Secondary | ICD-10-CM

## 2021-09-09 MED ORDER — RIZATRIPTAN BENZOATE 10 MG PO TABS: ORAL_TABLET | ORAL | 0 refills | Status: DC

## 2021-09-09 NOTE — Assessment & Plan Note (Signed)
Resolved. Reviewed labs from March 2023

## 2021-09-09 NOTE — Assessment & Plan Note (Signed)
Controlled.  Continue sertraline 50 mg daily. Continue to monitor.  

## 2021-09-09 NOTE — Assessment & Plan Note (Signed)
Ongoing, evaluated by ENT and the dentist.  No obvious cause as exam has always shown a normal oral cavity.  Continue to monitor.

## 2021-09-09 NOTE — Assessment & Plan Note (Signed)
Wegovy discontinued as it caused pancreatitis.  [facility] placed [facility].

## 2021-09-09 NOTE — Assessment & Plan Note (Signed)
Controlled.  Continue Singulair 10 mg PRN.

## 2021-09-09 NOTE — Assessment & Plan Note (Signed)
Waxes and wanes.  Discussed use of Miralax, good water intake, exercise.

## 2021-09-09 NOTE — Assessment & Plan Note (Signed)
Monthly migraines.   She is using Maxalt 10 mg sparingly. Continue Maxalt 10 mg PRN.

## 2021-09-09 NOTE — Progress Notes (Signed)
Subjective:    Patient ID: Meghan Jones, female    DOB: [birth year], 41 y.o.   MRN: [id number]  HPI  Meghan Jones is a very pleasant 41 y.o. female who presents today for complete physical and follow up of chronic conditions.  Immunizations: -Tetanus: 2021 -Influenza: Completed last season  -Covid-19: 3 vaccines   Diet: Fair diet.  Exercise: No regular exercise.  Eye exam: Completes annually  Dental exam: Completes semi-annually   Pap Smear: Hysterectomy  Mammogram: Completed in July 2022, ultrasound in February 2023  BP Readings from Last 3 Encounters:  09/09/21 110/80  04/22/21 110/80  04/15/21 134/80        Review of Systems  Constitutional:  Negative for unexpected weight change.  HENT:  Negative for rhinorrhea.   Eyes:  Negative for visual disturbance.  Respiratory:  Negative for cough and shortness of breath.   Cardiovascular:  Negative for chest pain.  Gastrointestinal:  Negative for constipation and diarrhea.  Genitourinary:  Negative for difficulty urinating.  Musculoskeletal:  Negative for arthralgias and myalgias.  Skin:  Negative for rash.  Allergic/Immunologic: Negative for environmental allergies.  Neurological:  Positive for headaches. Negative for dizziness.  Psychiatric/Behavioral:  The patient is not nervous/anxious.          Past Medical History:  Diagnosis Date   Abdominal pain 09/01/2020   Abdominal pain, left upper quadrant 04/15/2021   Abdominal pain, right upper quadrant 04/15/2021   Acute otalgia, left 05/22/2020   Allergy    Constipation    Controlled with Shakeology through [facility]   Decreased renal function 04/03/2018   Depression    Mild   Elevated lipase 04/22/2021   Nausea and vomiting 04/15/2021    Social History   Socioeconomic History   Marital status: Married    Spouse name: [name]   Number of children: 1   Years of education: 18   Highest education level: Not on file  Occupational History   Occupation: Transport planner: [employer]    Comment: [employer]  Tobacco Use   Smoking status: Never   Smokeless tobacco: Never  Substance and Sexual Activity   Alcohol use: Yes    Alcohol/week: 2.0 standard drinks of alcohol    Types: 1 Glasses of wine, 1 Shots of liquor per week    Comment: Occassionally 2 drinks weekly   Drug use: No   Sexual activity: Yes    Birth control/protection: Surgical  Other Topics Concern   Not on file  Social History Narrative   Meghan Jones grew up in [place], [place].    She attended [address] [employer] [place] in [place] ([employer]) and obtained her bachelors in Psychologist, sport and exercise.    She then attended [employer] and obtained her Masters in [employer].    She lives at home with her husband [name]) and daughter [name]).    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     Social Determinants of Health   Financial Resource Strain: Not on file  Food Insecurity: Not on file  Transportation Needs: Not on file  Physical Activity: Not on file  Stress: Not on file  Social Connections: Not on file  Intimate Partner Violence: Not on file    Past Surgical History:  Procedure Laterality Date   ABDOMINAL HYSTERECTOMY  2011   Asherman's Syndrome   APPENDECTOMY  2000    Family History  Problem Relation Age of Onset   Hyperlipidemia Maternal Grandfather  Hyperlipidemia Paternal Grandmother    Heart disease Paternal Grandmother 45       CAD - Died MI   Mental illness Paternal Grandmother    Hypertension Paternal Grandmother    Depression Paternal Grandmother        Suicide attempt x 3   Obesity Paternal Grandmother    Diabetes Paternal Grandmother    Hyperlipidemia Paternal Grandfather    Obesity Paternal Grandfather    Hypertension Paternal Grandfather    Diabetes Paternal Grandfather    Depression Sister    Breast cancer Neg Hx     No Known Allergies  Current Outpatient Medications on File Prior to Visit  Medication  Sig Dispense Refill   montelukast (SINGULAIR) 10 MG tablet Take 1 tablet (10 mg total) by mouth at bedtime. For allergies. Office visit required for further refills. 90 tablet 0   Multiple Vitamin (MULTIVITAMIN) tablet Take 1 tablet by mouth daily. Reported on 06/12/2015     sertraline (ZOLOFT) 50 MG tablet Take 50 mg by mouth daily.     fluticasone (FLONASE) 50 MCG/ACT nasal spray Place 1 spray into both nostrils 2 (two) times daily as needed for allergies or rhinitis. 48 g 0   No current facility-administered medications on file prior to visit.    BP 110/80   Pulse 61   Temp 98.6 F (37 C) (Oral)   Ht 5\' 6"  (1.676 m)   Wt 196 lb (88.9 kg)   SpO2 98%   BMI 31.64 kg/m  Objective:   Physical Exam HENT:     Right Ear: Tympanic membrane and ear canal normal.     Left Ear: Tympanic membrane and ear canal normal.     Nose: Nose normal.  Eyes:     Conjunctiva/sclera: Conjunctivae normal.     Pupils: Pupils are equal, round, and reactive to light.  Neck:     Thyroid: No thyromegaly.  Cardiovascular:     Rate and Rhythm: Normal rate and regular rhythm.     Heart sounds: No murmur heard. Pulmonary:     Effort: Pulmonary effort is normal.     Breath sounds: Normal breath sounds. No rales.  Abdominal:     General: Bowel sounds are normal.     Palpations: Abdomen is soft.     Tenderness: There is no abdominal tenderness.  Musculoskeletal:        General: Normal range of motion.     Cervical back: Neck supple.  Lymphadenopathy:     Cervical: No cervical adenopathy.  Skin:    General: Skin is warm and dry.     Findings: No rash.  Neurological:     Mental Status: She is alert and oriented to person, place, and time.     Cranial Nerves: No cranial nerve deficit.     Deep Tendon Reflexes: Reflexes are normal and symmetric.  Psychiatric:        Mood and Affect: Mood normal.           Assessment & Plan:   Problem List Items Addressed This Visit       Cardiovascular and  Mediastinum   Migraine    Monthly migraines.   She is using Maxalt 10 mg sparingly. Continue Maxalt 10 mg PRN.       Relevant Medications   rizatriptan (MAXALT) 10 MG tablet     Respiratory   Seasonal allergic rhinitis    Controlled.  Continue Singulair 10 mg PRN.         Digestive  Dry mouth    Ongoing, evaluated by ENT and the dentist.  No obvious cause as exam has always shown a normal oral cavity.  Continue to monitor.         Other   Preventative health care - Primary    Immunizations UTD. Mammogram due, orders placed.  Discussed the importance of a healthy diet and regular exercise in order for weight loss, and to reduce the risk of further co-morbidity.  Exam stable. Labs pending.  Follow up in 1 year for repeat physical.       Anxiety and depression    Controlled.  Continue sertraline 50 mg daily. Continue to monitor.       Frequent headaches    Overall stable.  Continue to monitor. Continue Maxalt 10 mg PRN      Relevant Medications   rizatriptan (MAXALT) 10 MG tablet   Obesity (BMI 30.0-34.9)    Wegovy discontinued as it caused pancreatitis.  [facility].      Relevant Orders   Amb Ref to [facility]   Constipation    Waxes and wanes.  Discussed use of Miralax, good water intake, exercise.       RESOLVED: Decreased renal function    Resolved. Reviewed labs from March 2023      Other Visit Diagnoses     Encounter for screening mammogram for malignant neoplasm of breast       Relevant Orders   US BREAST LTD UNI RIGHT INC AXILLA   MM DIAG BREAST TOMO BILATERAL   Mass of right breast, unspecified quadrant       Relevant Orders   US BREAST LTD UNI RIGHT INC AXILLA   MM DIAG BREAST TOMO BILATERAL          [name], NP

## 2021-09-09 NOTE — Assessment & Plan Note (Signed)
Immunizations UTD. Mammogram due, orders placed.  Discussed the importance of a healthy diet and regular exercise in order for weight loss, and to reduce the risk of further co-morbidity.  Exam stable. Labs pending.  Follow up in 1 year for repeat physical.

## 2021-09-09 NOTE — Assessment & Plan Note (Signed)
Overall stable.  Continue to monitor. Continue Maxalt 10 mg PRN

## 2021-09-09 NOTE — Patient Instructions (Addendum)
Call the [facility] to schedule your mammogram.   You will be contacted regarding your referral to the [facility].  Please let us know if you have not been contacted within two weeks.   It was a pleasure to see you today!  Preventive Care 34-42 Years Old, Female Preventive care refers to lifestyle choices and visits with your health care provider that can promote health and wellness. Preventive care visits are also called wellness exams. What can I expect for my preventive care visit? Counseling Your health care provider may ask you questions about your: Medical history, including: Past medical problems. Family medical history. Pregnancy history. Current health, including: Menstrual cycle. Method of birth control. Emotional well-being. Home life and relationship well-being. Sexual activity and sexual health. Lifestyle, including: Alcohol, nicotine or tobacco, and drug use. Access to firearms. Diet, exercise, and sleep habits. Work and work Statistician. Sunscreen use. Safety issues such as seatbelt and bike helmet use. Physical exam Your health care provider will check your: Height and weight. These may be used to calculate your BMI (body mass index). BMI is a measurement that tells if you are at a healthy weight. Waist circumference. This measures the distance around your waistline. This measurement also tells if you are at a healthy weight and may help predict your risk of certain diseases, such as type 2 diabetes and high blood pressure. Heart rate and blood pressure. Body temperature. Skin for abnormal spots. What immunizations do I need?  Vaccines are usually given at various ages, according to a schedule. Your health care provider will recommend vaccines for you based on your age, medical history, and lifestyle or other factors, such as travel or where you work. What tests do I need? Screening Your health care provider may recommend screening  tests for certain conditions. This may include: Lipid and cholesterol levels. Diabetes screening. This is done by checking your blood sugar (glucose) after you have not eaten for a while (fasting). Pelvic exam and Pap test. Hepatitis B test. Hepatitis C test. HIV (human immunodeficiency virus) test. STI (sexually transmitted infection) testing, if you are at risk. Lung cancer screening. Colorectal cancer screening. Mammogram. Talk with your health care provider about when you should start having regular mammograms. This may depend on whether you have a family history of breast cancer. BRCA-related cancer screening. This may be done if you have a family history of breast, ovarian, tubal, or peritoneal cancers. Bone density scan. This is done to screen for osteoporosis. Talk with your health care provider about your test results, treatment options, and if necessary, the need for more tests. Follow these instructions at home: Eating and drinking  Eat a diet that includes fresh fruits and vegetables, whole grains, lean protein, and low-fat dairy products. Take vitamin and mineral supplements as recommended by your health care provider. Do not drink alcohol if: Your health care provider tells you not to drink. You are pregnant, may be pregnant, or are planning to become pregnant. If you drink alcohol: Limit how much you have to 0-1 drink a day. Know how much alcohol is in your drink. In the [place] one drink equals one 12 oz bottle of beer (355 mL), one 5 oz glass of wine (148 mL), or one 1 oz glass of hard liquor (44 mL). Lifestyle Brush your teeth every morning and night with fluoride toothpaste. Floss one time each day. Exercise for at least 30 minutes 5 or more days each week. Do not use any  products that contain nicotine or tobacco. These products include cigarettes, chewing tobacco, and vaping devices, such as e-cigarettes. If you need help quitting, ask your health care provider. Do not  use drugs. If you are sexually active, practice safe sex. Use a condom or other form of protection to prevent STIs. If you do not wish to become pregnant, use a form of birth control. If you plan to become pregnant, see your health care provider for a prepregnancy visit. Take aspirin only as told by your health care provider. Make sure that you understand how much to take and what form to take. Work with your health care provider to find out whether it is safe and beneficial for you to take aspirin daily. Find healthy ways to manage stress, such as: Meditation, yoga, or listening to music. Journaling. Talking to a trusted person. Spending time with friends and family. Minimize exposure to UV radiation to reduce your risk of skin cancer. Safety Always wear your seat belt while driving or riding in a vehicle. Do not drive: If you have been drinking alcohol. Do not ride with someone who has been drinking. When you are tired or distracted. While texting. If you have been using any mind-altering substances or drugs. Wear a helmet and other protective equipment during sports activities. If you have firearms in your house, make sure you follow all gun safety procedures. Seek help if you have been physically or sexually abused. What's next? Visit your health care provider once a year for an annual wellness visit. Ask your health care provider how often you should have your eyes and teeth checked. Stay up to date on all vaccines. This information is not intended to replace advice given to you by your health care provider. Make sure you discuss any questions you have with your health care provider. Document Revised: 07/29/2020 Document Reviewed: 07/29/2020 Elsevier Patient Education  [place].

## 2021-09-28 ENCOUNTER — Other Ambulatory Visit: Payer: Self-pay | Admitting: Orthopedic Surgery

## 2021-09-28 DIAGNOSIS — S83242A Other tear of medial meniscus, current injury, left knee, initial encounter: Secondary | ICD-10-CM

## 2021-10-08 ENCOUNTER — Ambulatory Visit
Admission: RE | Admit: 2021-10-08 | Discharge: 2021-10-08 | Disposition: A | Discharge status: Home / Self Care | Payer: BC Managed Care – PPO | Source: Ambulatory Visit | Attending: Primary Care | Admitting: Primary Care

## 2021-10-08 DIAGNOSIS — Z1231 Encounter for screening mammogram for malignant neoplasm of breast: Secondary | ICD-10-CM | POA: Insufficient documentation

## 2021-10-08 DIAGNOSIS — N6310 Unspecified lump in the right breast, unspecified quadrant: Secondary | ICD-10-CM | POA: Diagnosis present

## 2021-10-09 ENCOUNTER — Ambulatory Visit
Admission: RE | Admit: 2021-10-09 | Discharge: 2021-10-09 | Disposition: A | Discharge status: Home / Self Care | Payer: BC Managed Care – PPO | Source: Ambulatory Visit | Attending: Orthopedic Surgery | Admitting: Orthopedic Surgery

## 2021-10-09 DIAGNOSIS — S83242A Other tear of medial meniscus, current injury, left knee, initial encounter: Secondary | ICD-10-CM

## 2021-10-22 ENCOUNTER — Ambulatory Visit
Admission: EM | Admit: 2021-10-22 | Discharge: 2021-10-22 | Disposition: A | Discharge status: Home / Self Care | Payer: BC Managed Care – PPO | Attending: Emergency Medicine | Admitting: Emergency Medicine

## 2021-10-22 DIAGNOSIS — H53149 Visual discomfort, unspecified: Secondary | ICD-10-CM

## 2021-10-22 DIAGNOSIS — R112 Nausea with vomiting, unspecified: Secondary | ICD-10-CM | POA: Diagnosis not present

## 2021-10-22 DIAGNOSIS — G43909 Migraine, unspecified, not intractable, without status migrainosus: Secondary | ICD-10-CM | POA: Diagnosis not present

## 2021-10-22 MED ORDER — DEXAMETHASONE SODIUM PHOSPHATE 10 MG/ML IJ SOLN
10.0000 mg | Freq: Once | INTRAMUSCULAR | Status: AC
  Administered 2021-10-22: 10 mg via INTRAMUSCULAR

## 2021-10-22 MED ORDER — ONDANSETRON 4 MG PO TBDP
4.0000 mg | ORAL_TABLET | Freq: Once | ORAL | Status: AC
  Administered 2021-10-22: 4 mg via ORAL

## 2021-10-22 MED ORDER — ONDANSETRON 4 MG PO TBDP: 4.0000 mg | ORAL_TABLET | Freq: Three times a day (TID) | ORAL | 0 refills | Status: DC | PRN

## 2021-10-22 MED ORDER — KETOROLAC TROMETHAMINE 30 MG/ML IJ SOLN
30.0000 mg | Freq: Once | INTRAMUSCULAR | Status: AC
  Administered 2021-10-22: 30 mg via INTRAMUSCULAR

## 2021-10-22 NOTE — ED Notes (Signed)
Patient provided with ice

## 2021-10-22 NOTE — ED Triage Notes (Signed)
Patient to Urgent Care with complaints of migraine that started last night. Reports hx of migraines since the age of 27.   Patient endorses nausea and vomiting, very sensitive to light.   Patient took imitrex this morning but vomited immediately after.

## 2021-10-22 NOTE — Discharge Instructions (Addendum)
You were given an injection of Toradol and dexamethasone for your migraine headache.  You were also given Zofran for nausea and vomiting.    Take the Zofran as directed.  Keep yourself hydrated with clear liquids, such as water and Gatorade.    Go to the emergency department if you have persistent or worsening symptoms.    Follow up with your primary care provider on Monday.

## 2021-10-22 NOTE — ED Provider Notes (Signed)
[facility]    CSN: [id number] Arrival date & time: 10/22/21  1051      History   Chief Complaint Chief Complaint  Patient presents with   Migraine    Entered by patient    HPI TANGEE MARSZALEK is a 41 y.o. female.  Patient presents with a migraine headache since last night.  She states this is typical of her usual migraine headaches.  No falls or trauma.  She attempted to take Imitrex this morning but vomited.  She has been unable to take OTC medications due to nausea and vomiting.  Her headache also has associated photophobia.  She denies focal weakness, numbness, dizziness, fever, chills, sore throat, cough, chest pain, shortness of breath, diarrhea, or other symptoms.  Her medical history includes migraine headaches.  The history is provided by the patient and medical records.    Past Medical History:  Diagnosis Date   Abdominal pain 09/01/2020   Abdominal pain, left upper quadrant 04/15/2021   Abdominal pain, right upper quadrant 04/15/2021   Acute otalgia, left 05/22/2020   Allergy    Constipation    Controlled with Shakeology through [facility]   Decreased renal function 04/03/2018   Depression    Mild   Elevated lipase 04/22/2021   Nausea and vomiting 04/15/2021    Patient Active Problem List   Diagnosis Date Noted   Dry mouth 04/22/2021   Constipation 04/15/2021   Chest pain at rest 04/13/2021   Obesity (BMI 30.0-34.9) 01/12/2021   Fatigue 05/22/2020   Pleuritic chest pain 03/15/2019   Migraine 12/10/2018   Frequent headaches 04/03/2018   Seasonal allergic rhinitis 05/10/2017   Preventative health care 02/06/2013   Anxiety and depression 02/06/2013    Past Surgical History:  Procedure Laterality Date   ABDOMINAL HYSTERECTOMY  2011   Asherman's Syndrome   APPENDECTOMY  2000    OB History   No obstetric history on file.      Home Medications    Prior to Admission medications   Medication Sig Start Date End Date Taking? Authorizing Provider   ondansetron (ZOFRAN-ODT) 4 MG disintegrating tablet Take 1 tablet (4 mg total) by mouth every 8 (eight) hours as needed for nausea or vomiting. 10/22/21  Yes Mickie Bail, NP  fluticasone (FLONASE) 50 MCG/ACT nasal spray Place 1 spray into both nostrils 2 (two) times daily as needed for allergies or rhinitis. 04/11/21   [name], NP  montelukast (SINGULAIR) 10 MG tablet Take 1 tablet (10 mg total) by mouth at bedtime. For allergies. Office visit required for further refills. 07/20/21   [name], NP  Multiple Vitamin (MULTIVITAMIN) tablet Take 1 tablet by mouth daily. Reported on 06/12/2015    [provider]  rizatriptan (MAXALT) 10 MG tablet Take 1 tablet by mouth at migraine onset. May repeat in 2 hours if needed 09/09/21   [name], NP  sertraline (ZOLOFT) 50 MG tablet Take 50 mg by mouth daily.    [provider]    Family History Family History  Problem Relation Age of Onset   Hyperlipidemia Maternal Grandfather    Hyperlipidemia Paternal Grandmother    Heart disease Paternal Grandmother 15       CAD - Died MI   Mental illness Paternal Grandmother    Hypertension Paternal Grandmother    Depression Paternal Grandmother        Suicide attempt x 3   Obesity Paternal Grandmother    Diabetes Paternal Grandmother  Hyperlipidemia Paternal Grandfather    Obesity Paternal Grandfather    Hypertension Paternal Grandfather    Diabetes Paternal Grandfather    Depression Sister    Breast cancer Neg Hx     Social History Social History   Tobacco Use   Smoking status: Never   Smokeless tobacco: Never  Substance Use Topics   Alcohol use: Yes    Alcohol/week: 2.0 standard drinks of alcohol    Types: 1 Glasses of wine, 1 Shots of liquor per week    Comment: Occassionally 2 drinks weekly   Drug use: No     Allergies   Patient has no known allergies.   Review of Systems Review of Systems  Constitutional:  Negative for chills and fever.   HENT:  Negative for ear pain and sore throat.   Eyes:  Negative for visual disturbance.  Respiratory:  Negative for cough and shortness of breath.   Cardiovascular:  Negative for chest pain and palpitations.  Gastrointestinal:  Positive for nausea and vomiting. Negative for abdominal pain.  Skin:  Negative for color change and rash.  Neurological:  Positive for headaches. Negative for dizziness, seizures, syncope, facial asymmetry, speech difficulty, weakness, light-headedness and numbness.  All other systems reviewed and are negative.    Physical Exam Triage Vital Signs ED Triage Vitals  Enc Vitals Group     BP      Pulse      Resp      Temp      Temp src      SpO2      Weight      Height      Head Circumference      Peak Flow      Pain Score      Pain Loc      Pain Edu?      Excl. in GC?    No data found.  Updated Vital Signs BP 125/80   Pulse 70   Temp 97.9 F (36.6 C)   Resp 18   Ht 5\' 6"  (1.676 m)   Wt 190 lb (86.2 kg)   SpO2 98%   BMI 30.67 kg/m   Visual Acuity Right Eye Distance:   Left Eye Distance:   Bilateral Distance:    Right Eye Near:   Left Eye Near:    Bilateral Near:     Physical Exam Vitals and nursing note reviewed.  Constitutional:      General: She is not in acute distress.    Appearance: She is well-developed. She is ill-appearing.  HENT:     Right Ear: Tympanic membrane normal.     Left Ear: Tympanic membrane normal.     Nose: Nose normal.     Mouth/Throat:     Mouth: Mucous membranes are moist.     Pharynx: Oropharynx is clear.  Eyes:     Pupils: Pupils are equal, round, and reactive to light.  Cardiovascular:     Rate and Rhythm: Normal rate and regular rhythm.     Heart sounds: Normal heart sounds.  Pulmonary:     Effort: Pulmonary effort is normal. No respiratory distress.     Breath sounds: Normal breath sounds.  Abdominal:     Palpations: Abdomen is soft.     Tenderness: There is no abdominal tenderness.   Musculoskeletal:     Cervical back: Neck supple.  Skin:    General: Skin is warm and dry.  Neurological:     General: No focal deficit  present.     Mental Status: She is alert and oriented to person, place, and time.     Cranial Nerves: No cranial nerve deficit.     Sensory: No sensory deficit.     Motor: No weakness.     Gait: Gait normal.  Psychiatric:        Mood and Affect: Mood normal.        Behavior: Behavior normal.      UC Treatments / Results  Labs (all labs ordered are listed, but only abnormal results are displayed) Labs Reviewed - No data to display  EKG   Radiology No results found.  Procedures Procedures (including critical care time)  Medications Ordered in UC Medications  ketorolac (TORADOL) 30 MG/ML injection 30 mg (has no administration in time range)  ondansetron (ZOFRAN-ODT) disintegrating tablet 4 mg (has no administration in time range)  dexamethasone (DECADRON) injection 10 mg (has no administration in time range)    Initial Impression / Assessment and Plan / UC Course  I have reviewed the triage vital signs and the nursing notes.  Pertinent labs & imaging results that were available during my care of the patient were reviewed by me and considered in my medical decision making (see chart for details).   Migraine headache with associated nausea and vomiting and photophobia.  Toradol, dexamethasone, Zofran given here.  Discharged home with prescription for Zofran.  Instructed patient to keep yourself hydrated.  If her headache persists, she will attempt Imitrex at home.  ED precautions discussed.  Instructed her to follow-up with her PCP on Monday.  Education provided on migraine headache, nausea and vomiting.  Patient agrees to plan of care.   Final Clinical Impressions(s) / UC Diagnoses   Final diagnoses:  Migraine without status migrainosus, not intractable, unspecified migraine type  Nausea and vomiting, unspecified vomiting type   Photophobia     Discharge Instructions      You were given an injection of Toradol and dexamethasone for your migraine headache.  You were also given Zofran for nausea and vomiting.    Take the Zofran as directed.  Keep yourself hydrated with clear liquids, such as water and Gatorade.    Go to the emergency department if you have persistent or worsening symptoms.    Follow up with your primary care provider on Monday.        ED Prescriptions     Medication Sig Dispense Auth. Provider   ondansetron (ZOFRAN-ODT) 4 MG disintegrating tablet Take 1 tablet (4 mg total) by mouth every 8 (eight) hours as needed for nausea or vomiting. 20 tablet Mickie Bail, NP      PDMP not reviewed this encounter.   Mickie Bail, NP 10/22/21 1135

## 2021-12-04 ENCOUNTER — Telehealth: Payer: BC Managed Care – PPO | Admitting: Physician Assistant

## 2021-12-04 DIAGNOSIS — R3989 Other symptoms and signs involving the genitourinary system: Secondary | ICD-10-CM | POA: Diagnosis not present

## 2021-12-04 MED ORDER — CEPHALEXIN 500 MG PO CAPS: 500.0000 mg | ORAL_CAPSULE | Freq: Two times a day (BID) | ORAL | 0 refills | Status: AC

## 2021-12-04 NOTE — Progress Notes (Signed)

## 2021-12-04 NOTE — Progress Notes (Signed)
I have spent 5 minutes in review of e-visit questionnaire, review and updating patient chart, medical decision making and response to patient.   Kjirsten Bloodgood Cody Tacora Athanas, PA-C    

## 2021-12-10 ENCOUNTER — Other Ambulatory Visit: Payer: Self-pay | Admitting: Primary Care

## 2021-12-10 DIAGNOSIS — G43909 Migraine, unspecified, not intractable, without status migrainosus: Secondary | ICD-10-CM

## 2021-12-10 MED ORDER — RIZATRIPTAN BENZOATE 10 MG PO TABS: ORAL_TABLET | ORAL | 0 refills | Status: DC

## 2021-12-15 ENCOUNTER — Encounter: Payer: Self-pay | Admitting: Primary Care

## 2021-12-15 ENCOUNTER — Ambulatory Visit (INDEPENDENT_AMBULATORY_CARE_PROVIDER_SITE_OTHER): Payer: BC Managed Care – PPO | Admitting: Primary Care

## 2021-12-15 VITALS — BP 128/76 | HR 62 | Temp 97.8°F | Ht 66.0 in | Wt 202.0 lb

## 2021-12-15 DIAGNOSIS — Z8379 Family history of other diseases of the digestive system: Secondary | ICD-10-CM | POA: Diagnosis not present

## 2021-12-15 DIAGNOSIS — K219 Gastro-esophageal reflux disease without esophagitis: Secondary | ICD-10-CM

## 2021-12-15 DIAGNOSIS — R682 Dry mouth, unspecified: Secondary | ICD-10-CM

## 2021-12-15 DIAGNOSIS — R748 Abnormal levels of other serum enzymes: Secondary | ICD-10-CM

## 2021-12-15 DIAGNOSIS — R079 Chest pain, unspecified: Secondary | ICD-10-CM

## 2021-12-15 MED ORDER — OMEPRAZOLE 40 MG PO CPDR: 40.0000 mg | DELAYED_RELEASE_CAPSULE | Freq: Every day | ORAL | 0 refills | Status: DC

## 2021-12-15 NOTE — Patient Instructions (Signed)
Stop by the lab prior to leaving today. I will notify you of your results once received.   Start omeprazole 40 mg every night for heartburn.  You will be contacted regarding your referral to GI.  Please let us know if you have not been contacted within two weeks.   It was a pleasure to see you today!

## 2021-12-15 NOTE — Assessment & Plan Note (Signed)
Suspect symptoms are secondary to uncontrolled GERD, especially with mild improvement on omeprazole 20 mg.  Checking lipase. Referral placed to GI. Start omeprazole 40 mg daily.  She will update.

## 2021-12-15 NOTE — Progress Notes (Signed)
Subjective:    Patient ID: Meghan Jones, female    DOB: October 25, 1980, 41 y.o.   MRN: [id number]  Chest Pain  Pertinent negatives include no shortness of breath.    Meghan Jones is a very pleasant 41 y.o. female with a history of dry mouth, migraines, fatigue who presents today to discuss chest pain.  Her pain is located to mid left chest which began around February 2023. Her pain occurs intermittently within a few hours after eating, pain is worse at night. Her pain alternates between sharp and dull, sometimes she wakes in the morning with her pain. She did take omeprazole 20 mg nightly x 4 weeks with some improvement tongue dryness and chest pain. She will occasionally take Tums, but this doesn't help.   Also with intermittent abdominal bloating with eating which has been chronic for years. Bowel movements alternate between diarrhea and constipation, was told years ago that she has IBS.   She has an appointment scheduled with GI for February 2024. Her mother has a history of Barrett's esophagus. She has never had a colonoscopy.   Evaluated in the ED in March 2023 for same symptoms, lipase was found to be slightly elevated. She was on Wegovy at the time, lipase improved with removal of Wegovy.   BP Readings from Last 3 Encounters:  12/15/21 128/76  10/22/21 125/80  09/09/21 110/80      Review of Systems  Respiratory:  Negative for shortness of breath.   Cardiovascular:  Positive for chest pain.  Gastrointestinal:  Positive for constipation and diarrhea.       Abdominal bloating         Past Medical History:  Diagnosis Date   Abdominal pain 09/01/2020   Abdominal pain, left upper quadrant 04/15/2021   Abdominal pain, right upper quadrant 04/15/2021   Acute otalgia, left 05/22/2020   Allergy    Constipation    Controlled with Shakeology through [facility]   Decreased renal function 04/03/2018   Depression    Mild   Elevated lipase 04/22/2021   Nausea and vomiting 04/15/2021     Social History   Socioeconomic History   Marital status: Married    Spouse name: [name]   Number of children: 1   Years of education: 18   Highest education level: Not on file  Occupational History   Occupation: Industrial/product designer: [employer]    Comment: Helper  Tobacco Use   Smoking status: Never   Smokeless tobacco: Never  Substance and Sexual Activity   Alcohol use: Yes    Alcohol/week: 2.0 standard drinks of alcohol    Types: 1 Glasses of wine, 1 Shots of liquor per week    Comment: Occassionally 2 drinks weekly   Drug use: No   Sexual activity: Yes    Birth control/protection: Surgical  Other Topics Concern   Not on file  Social History Narrative   Alejah grew up in [place], [place].    She attended [employer] in [place] ([employer]) and obtained her bachelors in Sport and exercise psychologist.    She then attended [employer] and obtained her Masters in [employer].    She lives at home with her husband [name]) and daughter [name]).    She enjoys working out Education officer, community.     Social Determinants of Health   Financial Resource Strain: Not on file  Food Insecurity: Not on file  Transportation Needs: Not on file  Physical  Activity: Not on file  Stress: Not on file  Social Connections: Not on file  Intimate Partner Violence: Not on file    Past Surgical History:  Procedure Laterality Date   ABDOMINAL HYSTERECTOMY  2011   Asherman's Syndrome   APPENDECTOMY  2000    Family History  Problem Relation Age of Onset   Hyperlipidemia Maternal Grandfather    Hyperlipidemia Paternal Grandmother    Heart disease Paternal Grandmother 77       CAD - Died MI   Mental illness Paternal Grandmother    Hypertension Paternal Grandmother    Depression Paternal Grandmother        Suicide attempt x 3   Obesity Paternal Grandmother    Diabetes Paternal Grandmother    Hyperlipidemia Paternal Grandfather    Obesity  Paternal Grandfather    Hypertension Paternal Grandfather    Diabetes Paternal Grandfather    Depression Sister    Breast cancer Neg Hx     No Known Allergies  Current Outpatient Medications on File Prior to Visit  Medication Sig Dispense Refill   fluticasone (FLONASE) 50 MCG/ACT nasal spray Place 1 spray into both nostrils 2 (two) times daily as needed for allergies or rhinitis. 48 g 0   montelukast (SINGULAIR) 10 MG tablet Take 1 tablet (10 mg total) by mouth at bedtime. For allergies. Office visit required for further refills. 90 tablet 0   Multiple Vitamin (MULTIVITAMIN) tablet Take 1 tablet by mouth daily. Reported on 06/12/2015     rizatriptan (MAXALT) 10 MG tablet Take 1 tablet by mouth at migraine onset. May repeat in 2 hours if needed 10 tablet 0   sertraline (ZOLOFT) 50 MG tablet Take 50 mg by mouth daily.     ondansetron (ZOFRAN-ODT) 4 MG disintegrating tablet Take 1 tablet (4 mg total) by mouth every 8 (eight) hours as needed for nausea or vomiting. 20 tablet 0   No current facility-administered medications on file prior to visit.    BP 128/76   Pulse 62   Temp 97.8 F (36.6 C) (Temporal)   Ht 5\' 6"  (1.676 m)   Wt 202 lb (91.6 kg)   SpO2 98%   BMI 32.60 kg/m  Objective:   Physical Exam Cardiovascular:     Rate and Rhythm: Normal rate.  Pulmonary:     Effort: Pulmonary effort is normal.  Abdominal:     General: Bowel sounds are normal.     Palpations: Abdomen is soft.     Tenderness: There is no abdominal tenderness.  Musculoskeletal:     Cervical back: Neck supple.  Skin:    General: Skin is warm and dry.           Assessment & Plan:   Problem List Items Addressed This Visit       Digestive   Dry mouth - Primary    Referral placed to GI.  Start omeprazole 40 mg HS. She will update.      Relevant Orders   Ambulatory referral to Gastroenterology   Gastroesophageal reflux disease    Suspect symptoms are secondary to uncontrolled GERD,  especially with mild improvement on omeprazole 20 mg.  Checking lipase. Referral placed to GI. Start omeprazole 40 mg daily.  She will update.      Relevant Medications   omeprazole (PRILOSEC) 40 MG capsule   Other Relevant Orders   Ambulatory referral to Gastroenterology     Other   Chest pain at rest    Suspect symptoms are secondary  to uncontrolled GERD, especially with mild improvement on omeprazole 20 mg.  Checking lipase. Referral placed to GI. Start omeprazole 40 mg daily.  She will update.      Other Visit Diagnoses     Family history of Barrett's esophagus       Relevant Orders   Ambulatory referral to Gastroenterology   Elevated lipase       Relevant Orders   Lipase          [name], NP

## 2021-12-15 NOTE — Assessment & Plan Note (Signed)
Suspect symptoms are secondary to uncontrolled GERD, especially with mild improvement on omeprazole 20 mg.  Checking lipase. Referral placed to GI. Start omeprazole 40 mg daily.  She will update. 

## 2021-12-15 NOTE — Assessment & Plan Note (Signed)
Referral placed to GI.  Start omeprazole 40 mg HS. She will update.

## 2021-12-16 ENCOUNTER — Ambulatory Visit: Payer: BC Managed Care – PPO | Admitting: Primary Care

## 2021-12-16 LAB — LIPASE: Lipase: 18 U/L (ref 11.0–59.0)

## 2021-12-21 NOTE — Progress Notes (Unsigned)
Gastroenterology Consultation  Referring Provider:     Pleas Koch, NP Primary Care Physician:  Pleas Koch, NP Primary Gastroenterologist:  Dr. Allen Norris     Reason for Consultation:     Bloating and abdominal pain        HPI:   Meghan Jones is a 41 y.o. y/o female referred for consultation & management of Bloating and abdominal pain by Dr. Carlis Abbott, Leticia Penna, NP.  This patient comes in today after recently seeing her primary care provider for dry mouth, migraines and fatigue.  The patient was also reporting chest pain.  The chest pain had begun at the beginning of the year and usually happens intermittently within a few hours of eating and worse at night.  The patient had been taking omeprazole 20 mg nightly with some improvement of her tongue dryness and her chest pain.  The patient also has a report of bloating with eating and has been going on for many years.  The patient also suffers from alternating diarrhea and constipation and has been told in the past that she has irritable bowel syndrome. The patient reports that her main concern today is the midsternal chest pain that she has.  The patient was taking over-the-counter omeprazole as stated above but was recently switched a few days ago to 40 mg prescription strength of omeprazole.  She has not noticed much change but she does report that she did not have the pain when she woke up this morning.  She reports that the pain is in the middle of the chest and can be very sharp and takes her breath away.  There is no report of any unexplained weight loss fevers chills nausea vomiting black stools or bloody stools.  She also denies any family history of colon cancer or colon polyps.  She does have family members with a hiatal hernia and a mother with Barrett's esophagus.   Past Medical History:  Diagnosis Date   Abdominal pain 09/01/2020   Abdominal pain, left upper quadrant 04/15/2021   Abdominal pain, right upper quadrant 04/15/2021    Acute otalgia, left 05/22/2020   Allergy    Constipation    Controlled with Shakeology through [facility]   Decreased renal function 04/03/2018   Depression    Mild   Elevated lipase 04/22/2021   Nausea and vomiting 04/15/2021    Past Surgical History:  Procedure Laterality Date   ABDOMINAL HYSTERECTOMY  2011   Asherman's Syndrome   APPENDECTOMY  2000    Prior to Admission medications   Medication Sig Start Date End Date Taking? Authorizing Provider  fluticasone (FLONASE) 50 MCG/ACT nasal spray Place 1 spray into both nostrils 2 (two) times daily as needed for allergies or rhinitis. 04/11/21   Pleas Koch, NP  montelukast (SINGULAIR) 10 MG tablet Take 1 tablet (10 mg total) by mouth at bedtime. For allergies. Office visit required for further refills. 07/20/21   Pleas Koch, NP  Multiple Vitamin (MULTIVITAMIN) tablet Take 1 tablet by mouth daily. Reported on 06/12/2015    [provider]  omeprazole (PRILOSEC) 40 MG capsule Take 1 capsule (40 mg total) by mouth at bedtime. For heartburn. 12/15/21   Pleas Koch, NP  ondansetron (ZOFRAN-ODT) 4 MG disintegrating tablet Take 1 tablet (4 mg total) by mouth every 8 (eight) hours as needed for nausea or vomiting. 10/22/21   Sharion Balloon, NP  rizatriptan (MAXALT) 10 MG tablet Take 1 tablet by mouth at migraine  onset. May repeat in 2 hours if needed 12/10/21   Pleas Koch, NP  sertraline (ZOLOFT) 50 MG tablet Take 50 mg by mouth daily.    [provider]    Family History  Problem Relation Age of Onset   Hyperlipidemia Maternal Grandfather    Hyperlipidemia Paternal Grandmother    Heart disease Paternal Grandmother 74       CAD - Died MI   Mental illness Paternal Grandmother    Hypertension Paternal Grandmother    Depression Paternal Grandmother        Suicide attempt x 3   Obesity Paternal Grandmother    Diabetes Paternal Grandmother    Hyperlipidemia Paternal Grandfather    Obesity Paternal  Grandfather    Hypertension Paternal Grandfather    Diabetes Paternal Grandfather    Depression Sister    Breast cancer Neg Hx      Social History   Tobacco Use   Smoking status: Never   Smokeless tobacco: Never  Substance Use Topics   Alcohol use: Yes    Alcohol/week: 2.0 standard drinks of alcohol    Types: 1 Glasses of wine, 1 Shots of liquor per week    Comment: Occassionally 2 drinks weekly   Drug use: No    Allergies as of 12/22/2021   (No Known Allergies)    Review of Systems:    All systems reviewed and negative except where noted in HPI.   Physical Exam:  There were no vitals taken for this visit. No LMP recorded. Patient has had a hysterectomy. General:   Alert,  Well-developed, well-nourished, pleasant and cooperative in NAD Head:  Normocephalic and atraumatic. Eyes:  Sclera clear, no icterus.   Conjunctiva pink. Ears:  Normal auditory acuity. Neck:  Supple; no masses or thyromegaly. Lungs:  Respirations even and unlabored.  Clear throughout to auscultation.   No wheezes, crackles, or rhonchi. No acute distress. Heart:  Regular rate and rhythm; no murmurs, clicks, rubs, or gallops. Abdomen:  Normal bowel sounds.  No bruits.  Soft, non-tender and non-distended without masses, hepatosplenomegaly or hernias noted.  No guarding or rebound tenderness.  Negative Carnett sign.   Rectal:  Deferred.  Pulses:  Normal pulses noted. Extremities:  No clubbing or edema.  No cyanosis. Neurologic:  Alert and oriented x3;  grossly normal neurologically. Skin:  Intact without significant lesions or rashes.  No jaundice. Lymph Nodes:  No significant cervical adenopathy. Psych:  Alert and cooperative. Normal mood and affect.  Imaging Studies: No results found.  Assessment and Plan:   Meghan Jones is a 41 y.o. y/o female who comes in today with a history of chest discomfort and a dry tongue.  The patient states the dry tongue is more of a film over her tongue and she is  not sure if it is caused by reflux or not.  The patient has been told that she should be set up for an EGD with a Bravo pH study.  That way we can see if the acid is the cause of her continued symptoms and the film feeling on her tongue.  The patient will follow-up at the time of the EGD and Bravo pH study while on her medication.  The patient also was put on Wegovy and had a mild increase in lipase to 87 and the medication was stopped.  The patient is questioning whether I believe she can restart the medication which I have told her I believe she can.  He had no  other signs or symptoms of pancreatitis at that time.  A repeat lipase came back to normal.  The patient has been explained the plan agrees with it.    Lucilla Lame, MD. Mar[facility] Regal    Note: This dictation was prepared with Dragon dictation along with smaller phrase technology. Any transcriptional errors that result from this process are unintentional.

## 2021-12-22 ENCOUNTER — Ambulatory Visit: Payer: BC Managed Care – PPO | Admitting: Gastroenterology

## 2021-12-22 ENCOUNTER — Encounter: Payer: Self-pay | Admitting: Gastroenterology

## 2021-12-22 VITALS — BP 117/78 | HR 67 | Temp 98.7°F | Ht 66.0 in | Wt 201.0 lb

## 2021-12-22 DIAGNOSIS — K219 Gastro-esophageal reflux disease without esophagitis: Secondary | ICD-10-CM

## 2021-12-22 DIAGNOSIS — R0789 Other chest pain: Secondary | ICD-10-CM | POA: Diagnosis not present

## 2021-12-22 DIAGNOSIS — E6609 Other obesity due to excess calories: Secondary | ICD-10-CM

## 2021-12-22 MED ORDER — WEGOVY 0.25 MG/0.5ML ~~LOC~~ SOAJ: SUBCUTANEOUS | 0 refills | Status: DC

## 2021-12-22 NOTE — Addendum Note (Signed)
Addended by: Roena Malady on: 12/22/2021 10:52 AM   Modules accepted: Orders

## 2021-12-23 ENCOUNTER — Telehealth: Payer: Self-pay

## 2021-12-23 NOTE — Telephone Encounter (Signed)
Prior auth started for [facility] 0.25MG /0.5ML auto-injectors. Meghan Jones (Key: [id number]) Rx #: [id number] Waiting for determination.

## 2021-12-23 NOTE — Telephone Encounter (Signed)
Prior auth for [employer] 0.25MG /0.5ML auto-injectors has been approved. Meghan Jones (Key: [id number]) Rx #: [id number] As long as you remain covered by the [facility] and there are no changes to your plan benefits, this request is approved for the following time period: 12/23/2021 - 07/24/2022  Approval letter sent to scanning.  Patient notified via mychart.

## 2021-12-25 ENCOUNTER — Ambulatory Visit
Admission: EM | Admit: 2021-12-25 | Discharge: 2021-12-25 | Disposition: A | Discharge status: Home / Self Care | Payer: BC Managed Care – PPO | Attending: Urgent Care | Admitting: Urgent Care

## 2021-12-25 DIAGNOSIS — R519 Headache, unspecified: Secondary | ICD-10-CM | POA: Diagnosis not present

## 2021-12-25 DIAGNOSIS — E860 Dehydration: Secondary | ICD-10-CM | POA: Diagnosis not present

## 2021-12-25 DIAGNOSIS — R1110 Vomiting, unspecified: Secondary | ICD-10-CM

## 2021-12-25 MED ORDER — DEXAMETHASONE SODIUM PHOSPHATE 10 MG/ML IJ SOLN
10.0000 mg | Freq: Once | INTRAMUSCULAR | Status: AC
  Administered 2021-12-25: 10 mg via INTRAMUSCULAR

## 2021-12-25 MED ORDER — SODIUM CHLORIDE 0.9 % IV BOLUS
1000.0000 mL | Freq: Once | INTRAVENOUS | Status: AC
  Administered 2021-12-25: 1000 mL via INTRAVENOUS

## 2021-12-25 MED ORDER — SODIUM CHLORIDE 0.9 % IV SOLN
25.0000 mg | Freq: Once | INTRAVENOUS | Status: AC
  Administered 2021-12-25: 25 mg via INTRAVENOUS

## 2021-12-25 MED ORDER — KETOROLAC TROMETHAMINE 30 MG/ML IJ SOLN
30.0000 mg | Freq: Once | INTRAMUSCULAR | Status: AC
  Administered 2021-12-25: 30 mg via INTRAMUSCULAR

## 2021-12-25 NOTE — ED Triage Notes (Signed)
Pt. Presents to UC w/ c/o a migraine and emesis episodes for the past 24hrs. Pt. States she has lost count of how many emesis episodes she has had and expresses that the contents have been grey. Pt. Also states she is unable to keep fluids and medicine down at the moment.

## 2021-12-25 NOTE — Discharge Instructions (Addendum)
Please follow-up with your primary care provider regarding possible need for evaluation of your worsening pattern of headaches and step up in treatment.

## 2021-12-25 NOTE — ED Provider Notes (Addendum)
Roderic Palau    CSN: [id number] Arrival date & time: 12/25/21  [id number]      History   Chief Complaint Chief Complaint  Patient presents with   Migraine    Vomiting - Entered by patient   Emesis    HPI Meghan Jones is a 41 y.o. female.    Migraine  Emesis   Presents to UC with complaint of headache accompanied by episodes of emesis x24 hours.  Patient endorses multiple episodes of emesis and unable to drink water x24 hours.  She endorses severe thirst.  Unable to keep foods and medicine down without vomiting.  She endorses photophobia and lights turned down in exam room.  Patient with history of migraines with acute episodes normally managed with triptan.  Patient states she is unable to hold triptan down in her stomach this time.  She reports recent episode (chart shows 10/22/2021) where she was given ondansetron ODT in clinic but vomited up medication immediately.  Was given Decadron and Toradol in clinic.  Past Medical History:  Diagnosis Date   Abdominal pain 09/01/2020   Abdominal pain, left upper quadrant 04/15/2021   Abdominal pain, right upper quadrant 04/15/2021   Acute otalgia, left 05/22/2020   Allergy    Constipation    Controlled with Shakeology through [facility]   Decreased renal function 04/03/2018   Depression    Mild   Elevated lipase 04/22/2021   Nausea and vomiting 04/15/2021    Patient Active Problem List   Diagnosis Date Noted   Gastroesophageal reflux disease 12/15/2021   Dry mouth 04/22/2021   Constipation 04/15/2021   Chest pain at rest 04/13/2021   Obesity (BMI 30.0-34.9) 01/12/2021   Fatigue 05/22/2020   Pleuritic chest pain 03/15/2019   Migraine 12/10/2018   Frequent headaches 04/03/2018   Seasonal allergic rhinitis 05/10/2017   Preventative health care 02/06/2013   Anxiety and depression 02/06/2013    Past Surgical History:  Procedure Laterality Date   ABDOMINAL HYSTERECTOMY  2011   Asherman's Syndrome   APPENDECTOMY  2000     OB History   No obstetric history on file.      Home Medications    Prior to Admission medications   Medication Sig Start Date End Date Taking? Authorizing Provider  montelukast (SINGULAIR) 10 MG tablet Take 1 tablet (10 mg total) by mouth at bedtime. For allergies. Office visit required for further refills. 07/20/21   Pleas Koch, NP  Multiple Vitamin (MULTIVITAMIN) tablet Take 1 tablet by mouth daily. Reported on 06/12/2015    [provider]  omeprazole (PRILOSEC) 40 MG capsule Take 1 capsule (40 mg total) by mouth at bedtime. For heartburn. 12/15/21   Pleas Koch, NP  rizatriptan (MAXALT) 10 MG tablet Take 1 tablet by mouth at migraine onset. May repeat in 2 hours if needed 12/10/21   Pleas Koch, NP  Semaglutide-Weight Management (WEGOVY) 0.25 MG/0.5ML SOAJ Inject 0.25 mg weekly x 4 weeks, then increase to 0.5 mg weekly thereafter. 12/22/21   Pleas Koch, NP  sertraline (ZOLOFT) 50 MG tablet Take 50 mg by mouth daily.    [provider]    Family History Family History  Problem Relation Age of Onset   Hyperlipidemia Maternal Grandfather    Hyperlipidemia Paternal Grandmother    Heart disease Paternal Grandmother 53       CAD - Died MI   Mental illness Paternal Grandmother    Hypertension Paternal Grandmother    Depression Paternal Grandmother  Suicide attempt x 3   Obesity Paternal Grandmother    Diabetes Paternal Grandmother    Hyperlipidemia Paternal Grandfather    Obesity Paternal Grandfather    Hypertension Paternal Grandfather    Diabetes Paternal Grandfather    Depression Sister    Breast cancer Neg Hx     Social History Social History   Tobacco Use   Smoking status: Never   Smokeless tobacco: Never  Substance Use Topics   Alcohol use: Yes    Alcohol/week: 2.0 standard drinks of alcohol    Types: 1 Glasses of wine, 1 Shots of liquor per week    Comment: Occassionally 2 drinks weekly   Drug use: No      Allergies   Patient has no known allergies.   Review of Systems Review of Systems  Gastrointestinal:  Positive for vomiting.     Physical Exam Triage Vital Signs ED Triage Vitals [12/25/21 0832]  Enc Vitals Group     BP 119/80     Pulse Rate 70     Resp 16     Temp 97.9 F (36.6 C)     Temp src      SpO2 98 %     Weight      Height      Head Circumference      Peak Flow      Pain Score 10     Pain Loc      Pain Edu?      Excl. in [place]?    No data found.  Updated Vital Signs BP 119/80   Pulse 70   Temp 97.9 F (36.6 C)   Resp 16   SpO2 98%   Visual Acuity Right Eye Distance:   Left Eye Distance:   Bilateral Distance:    Right Eye Near:   Left Eye Near:    Bilateral Near:     Physical Exam Vitals reviewed.  Constitutional:      Appearance: She is ill-appearing.  Cardiovascular:     Rate and Rhythm: Normal rate and regular rhythm.  Pulmonary:     Effort: Pulmonary effort is normal.     Breath sounds: Normal breath sounds.  Skin:    General: Skin is warm and dry.  Neurological:     General: No focal deficit present.     Mental Status: She is alert and oriented to person, place, and time.  Psychiatric:        Mood and Affect: Mood normal.        Behavior: Behavior normal.      UC Treatments / Results  Labs (all labs ordered are listed, but only abnormal results are displayed) Labs Reviewed - No data to display  EKG   Radiology No results found.  Procedures Procedures (including critical care time)  Medications Ordered in UC Medications - No data to display  Initial Impression / Assessment and Plan / UC Course  I have reviewed the triage vital signs and the nursing notes.  Pertinent labs & imaging results that were available during my care of the patient were reviewed by me and considered in my medical decision making (see chart for details).   Ill appearing patient presents in presumed state of dehydration due to hyperemesis  x24 hours and unable to hold down fluids.  She has headache which is presumed migraine, not responsive to typical treatment plan.  We will give a bolus of 1 L normal saline for the dehydration.  Will provide promethazine 25  mg administered in the 1 L bolus.  We will also give Decadron 10 mg IM to reduce risk of early recurrence as well as Toradol 30 mg IM.  Patient will be driven home by her husband who lives nearby.  NS 1 L was administered without issue.  Patient states she feels improved symptoms with reduced headache and nausea, endorses some sleepiness.  Patient discharged to home with recommendation that she use Benadryl and try to sleep today.  Also recommend follow-up with her PCP and possibly headache specialist.  Final Clinical Impressions(s) / UC Diagnoses   Final diagnoses:  None   Discharge Instructions   None    ED Prescriptions   None    PDMP not reviewed this encounter.   Charma Igo, FNP 12/25/21 0901    Charma Igo, FNP 12/25/21 0901    Charma Igo, FNP 12/25/21 1005

## 2022-01-14 ENCOUNTER — Other Ambulatory Visit: Payer: Self-pay | Admitting: Primary Care

## 2022-01-14 ENCOUNTER — Other Ambulatory Visit: Payer: Self-pay

## 2022-01-14 DIAGNOSIS — E6609 Other obesity due to excess calories: Secondary | ICD-10-CM

## 2022-01-14 MED ORDER — WEGOVY 0.25 MG/0.5ML ~~LOC~~ SOAJ: SUBCUTANEOUS | 0 refills | Status: DC

## 2022-01-14 NOTE — Telephone Encounter (Signed)
Fax request to send Meghan Jones to [facility] mail order

## 2022-01-18 ENCOUNTER — Telehealth: Payer: Self-pay

## 2022-01-18 NOTE — Telephone Encounter (Signed)
Noted  

## 2022-01-18 NOTE — Telephone Encounter (Signed)
error 

## 2022-01-18 NOTE — Telephone Encounter (Signed)
[facility] called to make [place] aware that the prescription for [facility] 0.25mg  weekly for 4 weeks can be filled in the prefilled pens, but the Wegovy 0.5 mg dose noted to do after the 4 weeks will need a separate prescription.

## 2022-01-25 ENCOUNTER — Encounter
Admission: RE | Disposition: A | Discharge status: Home / Self Care | Payer: Self-pay | Source: Ambulatory Visit | Attending: Gastroenterology

## 2022-01-25 ENCOUNTER — Encounter: Payer: Self-pay | Admitting: Gastroenterology

## 2022-01-25 ENCOUNTER — Other Ambulatory Visit: Payer: Self-pay

## 2022-01-25 ENCOUNTER — Ambulatory Visit: Payer: BC Managed Care – PPO | Admitting: Certified Registered"

## 2022-01-25 ENCOUNTER — Ambulatory Visit
Admission: RE | Admit: 2022-01-25 | Discharge: 2022-01-25 | Disposition: A | Discharge status: Home / Self Care | Payer: BC Managed Care – PPO | Source: Ambulatory Visit | Attending: Gastroenterology | Admitting: Gastroenterology

## 2022-01-25 DIAGNOSIS — K449 Diaphragmatic hernia without obstruction or gangrene: Secondary | ICD-10-CM | POA: Insufficient documentation

## 2022-01-25 DIAGNOSIS — K219 Gastro-esophageal reflux disease without esophagitis: Secondary | ICD-10-CM

## 2022-01-25 DIAGNOSIS — R12 Heartburn: Secondary | ICD-10-CM | POA: Insufficient documentation

## 2022-01-25 DIAGNOSIS — R0789 Other chest pain: Secondary | ICD-10-CM

## 2022-01-25 DIAGNOSIS — Z90710 Acquired absence of both cervix and uterus: Secondary | ICD-10-CM | POA: Insufficient documentation

## 2022-01-25 HISTORY — PX: ESOPHAGOGASTRODUODENOSCOPY (EGD) WITH PROPOFOL: SHX5813

## 2022-01-25 HISTORY — PX: BRAVO PH STUDY: SHX5421

## 2022-01-25 SURGERY — ESOPHAGOGASTRODUODENOSCOPY (EGD) WITH PROPOFOL
Anesthesia: General

## 2022-01-25 MED ORDER — PROPOFOL 10 MG/ML IV BOLUS
INTRAVENOUS | Status: DC | PRN
  Administered 2022-01-25: 125 mg via INTRAVENOUS
  Administered 2022-01-25: 125 ug/kg/min via INTRAVENOUS

## 2022-01-25 MED ORDER — PROPOFOL 10 MG/ML IV BOLUS
INTRAVENOUS | Status: AC
  Filled 2022-01-25: qty 40

## 2022-01-25 MED ORDER — SODIUM CHLORIDE 0.9 % IV SOLN: INTRAVENOUS | Status: DC

## 2022-01-25 MED ORDER — LACTATED RINGERS IV SOLN: INTRAVENOUS | Status: DC | PRN

## 2022-01-25 MED ORDER — LIDOCAINE HCL (CARDIAC) PF 100 MG/5ML IV SOSY
PREFILLED_SYRINGE | INTRAVENOUS | Status: DC | PRN
  Administered 2022-01-25: 100 mg via INTRAVENOUS

## 2022-01-25 NOTE — Anesthesia Preprocedure Evaluation (Addendum)
Anesthesia Evaluation  Patient identified by MRN, date of birth, ID band Patient awake    Reviewed: Allergy & Precautions, NPO status , Patient's Chart, lab work & pertinent test results  History of Anesthesia Complications (+) PONV and history of anesthetic complications  Airway Mallampati: I  TM Distance: >3 FB Neck ROM: full    Dental  (+) Teeth Intact   Pulmonary neg pulmonary ROS   Pulmonary exam normal        Cardiovascular negative cardio ROS Normal cardiovascular exam     Neuro/Psych  Headaches PSYCHIATRIC DISORDERS Anxiety Depression       GI/Hepatic Neg liver ROS,GERD  ,,  Endo/Other  negative endocrine ROS    Renal/GU negative Renal ROS  negative genitourinary   Musculoskeletal   Abdominal   Peds  Hematology negative hematology ROS (+)   Anesthesia Other Findings Past Medical History: 09/01/2020: Abdominal pain 04/15/2021: Abdominal pain, left upper quadrant 04/15/2021: Abdominal pain, right upper quadrant 05/22/2020: Acute otalgia, left No date: Allergy No date: Constipation     Comment:  Controlled with Shakeology through [facility] 04/03/2018: Decreased renal function No date: Depression     Comment:  Mild 04/22/2021: Elevated lipase 04/15/2021: Nausea and vomiting  Past Surgical History: 02/14/2009: ABDOMINAL HYSTERECTOMY     Comment:  Asherman's Syndrome 02/14/1998: APPENDECTOMY No date: HYSTEROSCOPY No date: KNEE ARTHROSCOPY W/ MENISCAL REPAIR; Right  BMI    Body Mass Index: 31.64 kg/m      Reproductive/Obstetrics negative OB ROS                             Anesthesia Physical Anesthesia Plan  ASA: 2  Anesthesia Plan: General   Post-op Pain Management: Minimal or no pain anticipated   Induction: Intravenous  PONV Risk Score and Plan: Propofol infusion and TIVA  Airway Management Planned: Natural Airway and Nasal Cannula  Additional Equipment:    Intra-op Plan:   Post-operative Plan:   Informed Consent: I have reviewed the patients History and Physical, chart, labs and discussed the procedure including the risks, benefits and alternatives for the proposed anesthesia with the patient or authorized representative who has indicated his/her understanding and acceptance.     Dental Advisory Given  Plan Discussed with: Anesthesiologist, CRNA and Surgeon  Anesthesia Plan Comments: (Patient consented for risks of anesthesia including but not limited to:  - adverse reactions to medications - risk of airway placement if required - damage to eyes, teeth, lips or other oral mucosa - nerve damage due to positioning  - sore throat or hoarseness - Damage to heart, brain, nerves, lungs, other parts of body or loss of life  Patient voiced understanding.)       Anesthesia Quick Evaluation

## 2022-01-25 NOTE — Transfer of Care (Signed)
Immediate Anesthesia Transfer of Care Note  Patient: Meghan Jones  Procedure(s) Performed: ESOPHAGOGASTRODUODENOSCOPY (EGD) WITH PROPOFOL BRAVO PH STUDY  Patient Location: PACU  Anesthesia Type:General  Level of Consciousness: awake  Airway & Oxygen Therapy: Patient Spontanous Breathing  Post-op Assessment: Report given to RN  Post vital signs: Reviewed  Last Vitals:  Vitals Value Taken Time  BP 116/73 01/25/22 0908  Temp 36 C 01/25/22 0907  Pulse 75 01/25/22 0909  Resp 12 01/25/22 0909  SpO2 98 % 01/25/22 0909  Vitals shown include unvalidated device data.  Last Pain:  Vitals:   01/25/22 0907  TempSrc: Temporal  PainSc: 0-No pain         Complications: No notable events documented.

## 2022-01-25 NOTE — OR Nursing (Signed)
Bravo PH study,  EG Junction at 40cm Bravo PH capsule placed at 34cm,

## 2022-01-25 NOTE — Op Note (Signed)
[facility] Gastroenterology Patient Name: Meghan Jones Procedure Date: 01/25/2022 8:49 AM MRN: [id number] Account #: [id number] Date of Birth: [birth year] Admit Type: Outpatient Age: 41 Room: [facility] ENDO ROOM 4 Gender: Female Note Status: Finalized Instrument Name: Upper Endoscope [id number] Procedure:             Upper GI endoscopy Indications:           Heartburn despite a PPI Providers:             Midge Minium MD, MD Referring MD:          No Local Md, MD (Referring MD) Medicines:             Propofol per Anesthesia Complications:         No immediate complications. Procedure:             Pre-Anesthesia Assessment:                        - Prior to the procedure, a History and Physical was                         performed, and patient medications and allergies were                         reviewed. The patient's tolerance of previous                         anesthesia was also reviewed. The risks and benefits                         of the procedure and the sedation options and risks                         were discussed with the patient. All questions were                         answered, and informed consent was obtained. Prior                         Anticoagulants: The patient has taken no anticoagulant                         or antiplatelet agents. ASA Grade Assessment: II - A                         patient with mild systemic disease. After reviewing                         the risks and benefits, the patient was deemed in                         satisfactory condition to undergo the procedure.                        After obtaining informed consent, the endoscope was                         passed under direct vision. Throughout the procedure,  the patient's blood pressure, pulse, and oxygen                         saturations were monitored continuously. The                         Endosonoscope was introduced through the mouth,  and                         advanced to the second part of duodenum. The upper GI                         endoscopy was accomplished without difficulty. The                         patient tolerated the procedure well. Findings:      A small hiatal hernia was present.      The stomach was normal.      The examined duodenum was normal.      The BRAVO capsule with delivery system was introduced through the mouth       and advanced into the esophagus, such that the BRAVO pH capsule was       positioned 34 cm from the incisors, which was 6 cm proximal to the GE       junction. The BRAVO pH capsule was then deployed and attached to the       esophageal mucosa. The delivery system was then withdrawn. Endoscopy was       utilized for probe placement and diagnostic evaluation. The scope was       reinserted to evaluate placement of the BRAVO capsule. Visualization       showed the BRAVO capsule to be in an appropriate position. Impression:            - Small hiatal hernia.                        - Normal stomach.                        - Normal examined duodenum.                        - The BRAVO pH capsule was deployed.                        - No specimens collected. Recommendation:        - Discharge patient to home.                        - Resume previous diet.                        - Continue present medications. Procedure Code(s):     --- Professional ---                        [phone], Esophagogastroduodenoscopy, flexible,                         transoral; diagnostic, including collection of  specimen(s) by brushing or washing, when performed                         (separate procedure) Diagnosis Code(s):     --- Professional ---                        R12, Heartburn CPT copyright 2022 American Medical Association. All rights reserved. The codes documented in this report are preliminary and upon coder review may  be revised to meet current compliance  requirements. Midge Minium MD, MD 01/25/2022 9:06:51 AM This report has been signed electronically. Number of Addenda: 0 Note Initiated On: 01/25/2022 8:49 AM Estimated Blood Loss:  Estimated blood loss: none.      [facility]

## 2022-01-25 NOTE — H&P (Signed)
Meghan Minium, MD [facility] [address], [address] [place], [place] [address] [phone] Fax : [phone]  Primary Care Physician:  Meghan Nest, NP Primary Gastroenterologist:  Dr. Servando Jones  Pre-Procedure History & Physical: HPI:  Meghan Jones is a 41 y.o. female is here for an endoscopy.   Past Medical History:  Diagnosis Date   Abdominal pain 09/01/2020   Abdominal pain, left upper quadrant 04/15/2021   Abdominal pain, right upper quadrant 04/15/2021   Acute otalgia, left 05/22/2020   Allergy    Constipation    Controlled with Shakeology through [facility]   Decreased renal function 04/03/2018   Depression    Mild   Elevated lipase 04/22/2021   Nausea and vomiting 04/15/2021    Past Surgical History:  Procedure Laterality Date   ABDOMINAL HYSTERECTOMY  02/14/2009   Asherman's Syndrome   APPENDECTOMY  02/14/1998   HYSTEROSCOPY     KNEE ARTHROSCOPY W/ MENISCAL REPAIR Right     Prior to Admission medications   Medication Sig Start Date End Date Taking? Authorizing Provider  montelukast (SINGULAIR) 10 MG tablet Take 1 tablet (10 mg total) by mouth at bedtime. For allergies. Office visit required for further refills. 07/20/21  Yes Meghan Nest, NP  Multiple Vitamin (MULTIVITAMIN) tablet Take 1 tablet by mouth daily. Reported on 06/12/2015   Yes [provider]  omeprazole (PRILOSEC) 40 MG capsule Take 1 capsule (40 mg total) by mouth at bedtime. For heartburn. 12/15/21  Yes Meghan Nest, NP  rizatriptan (MAXALT) 10 MG tablet Take 1 tablet by mouth at migraine onset. May repeat in 2 hours if needed 12/10/21  Yes Meghan Nest, NP  Semaglutide-Weight Management (WEGOVY) 0.25 MG/0.5ML SOAJ Inject 0.25 mg into the skin once a week. Inject 0.25 mg weekly x 4 weeks, then increase to 0.5 mg weekly thereafter. 01/16/22  Yes Meghan Nest, NP  sertraline (ZOLOFT) 50 MG tablet Take 50 mg by mouth daily.   Yes [provider]    Allergies as of  12/22/2021   (No Known Allergies)    Family History  Problem Relation Age of Onset   Hyperlipidemia Maternal Grandfather    Hyperlipidemia Paternal Grandmother    Heart disease Paternal Grandmother 21       CAD - Died MI   Mental illness Paternal Grandmother    Hypertension Paternal Grandmother    Depression Paternal Grandmother        Suicide attempt x 3   Obesity Paternal Grandmother    Diabetes Paternal Grandmother    Hyperlipidemia Paternal Grandfather    Obesity Paternal Grandfather    Hypertension Paternal Grandfather    Diabetes Paternal Grandfather    Depression Sister    Breast cancer Neg Hx     Social History   Socioeconomic History   Marital status: Married    Spouse name: Meghan Jones   Number of children: 1   Years of education: 18   Highest education level: Not on file  Occupational History   Occupation: Mining engineer: [employer]    Comment: Designer, multimedia  Tobacco Use   Smoking status: Never   Smokeless tobacco: Never  Vaping Use   Vaping Use: Never used  Substance and Sexual Activity   Alcohol use: Yes    Alcohol/week: 2.0 standard drinks of alcohol    Types: 1 Glasses of wine, 1 Shots of liquor per week    Comment: Occassionally 2 drinks weekly  Drug use: No   Sexual activity: Yes    Birth control/protection: Surgical  Other Topics Concern   Not on file  Social History Narrative   Gay grew up in [place], [place].    She attended [address] [employer] [place] in [place] ([employer]) and obtained her bachelors in Psychologist, sport and exercise.    She then attended [employer] and obtained her Masters in [employer].    She lives at home with her husband Meghan Jones) and daughter Meghan Jones).    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     Social Determinants of Health   Financial Resource Strain: Not on file  Food Insecurity: Not on file  Transportation Needs: Not on file  Physical Activity: Not on file  Stress: Not on  file  Social Connections: Not on file  Intimate Partner Violence: Not on file    Review of Systems: See HPI, otherwise negative ROS  Physical Exam: BP 120/80   Pulse 69   Temp 98.6 F (37 C) (Temporal)   Resp 16   Ht 5\' 6"  (1.676 m)   Wt 88.9 kg   SpO2 99%   BMI 31.64 kg/m  General:   Alert,  pleasant and cooperative in NAD Head:  Normocephalic and atraumatic. Neck:  Supple; no masses or thyromegaly. Lungs:  Clear throughout to auscultation.    Heart:  Regular rate and rhythm. Abdomen:  Soft, nontender and nondistended. Normal bowel sounds, without guarding, and without rebound.   Neurologic:  Alert and  oriented x4;  grossly normal neurologically.  Impression/Plan: Meghan Jones is here for an endoscopy to be performed for GERD  Risks, benefits, limitations, and alternatives regarding  endoscopy have been reviewed with the patient.  Questions have been answered.  All parties agreeable.   Meghan Pitt, MD  01/25/2022, 8:45 AM

## 2022-01-25 NOTE — Anesthesia Postprocedure Evaluation (Signed)
Anesthesia Post Note  Patient: Meghan Jones  Procedure(s) Performed: ESOPHAGOGASTRODUODENOSCOPY (EGD) WITH PROPOFOL BRAVO PH STUDY  Patient location during evaluation: Endoscopy Anesthesia Type: General Level of consciousness: awake and alert Pain management: pain level controlled Vital Signs Assessment: post-procedure vital signs reviewed and stable Respiratory status: spontaneous breathing, nonlabored ventilation, respiratory function stable and patient connected to nasal cannula oxygen Cardiovascular status: blood pressure returned to baseline and stable Postop Assessment: no apparent nausea or vomiting Anesthetic complications: no  No notable events documented.   Last Vitals:  Vitals:   01/25/22 0740 01/25/22 0907  BP: 120/80 116/73  Pulse: 69 85  Resp: 16 19  Temp: 37 C (!) 36 C  SpO2: 99% 99%    Last Pain:  Vitals:   01/25/22 0907  TempSrc: Temporal  PainSc: 0-No pain                 Louie Boston

## 2022-01-26 ENCOUNTER — Encounter: Payer: Self-pay | Admitting: Gastroenterology

## 2022-01-27 ENCOUNTER — Other Ambulatory Visit: Payer: Self-pay | Admitting: Primary Care

## 2022-01-27 ENCOUNTER — Telehealth: Payer: Self-pay

## 2022-01-27 DIAGNOSIS — F419 Anxiety disorder, unspecified: Secondary | ICD-10-CM

## 2022-01-27 NOTE — Telephone Encounter (Signed)
Patient called and left me a voicemail stating that Dr. Servando Snare had done an EGD w/ Bravo PH Study on 01/25/2022 and she did not know what to do next. She stated that she understood that she needed a follow up appointment to discuss results. However, nothing had been scheduled. Is there anything else patient needs? Please advise the front desk if she needs an appointment. Or if she needs something else, please let me know. Thank you.

## 2022-02-06 ENCOUNTER — Other Ambulatory Visit: Payer: Self-pay | Admitting: Primary Care

## 2022-02-06 DIAGNOSIS — E6609 Other obesity due to excess calories: Secondary | ICD-10-CM

## 2022-02-06 MED ORDER — WEGOVY 0.25 MG/0.5ML ~~LOC~~ SOAJ: 0.2500 mg | SUBCUTANEOUS | 0 refills | Status: DC

## 2022-02-11 ENCOUNTER — Encounter: Payer: Self-pay | Admitting: Gastroenterology

## 2022-03-01 NOTE — Telephone Encounter (Signed)
Per the report given to me last week, called pt and lmovm

## 2022-03-02 MED ORDER — SEMAGLUTIDE-WEIGHT MANAGEMENT 0.5 MG/0.5ML ~~LOC~~ SOAJ: 0.5000 mg | SUBCUTANEOUS | 0 refills | Status: DC

## 2022-03-02 NOTE — Addendum Note (Signed)
Addended by: Pleas Koch on: 03/02/2022 06:05 PM   Modules accepted: Orders

## 2022-03-11 NOTE — Telephone Encounter (Signed)
Left message on voicemail.

## 2022-03-30 ENCOUNTER — Ambulatory Visit: Payer: BC Managed Care – PPO | Admitting: Gastroenterology

## 2022-04-04 IMAGING — CT CT ABD-PELV W/ CM
2 of 5 series · 16 of 46 positions shown, 18 images · IV contrast (agent unspecified)
Comparison: None.

CLINICAL DATA: Abdominal pain and vomiting.  Constipation.

EXAM:
CT ABDOMEN AND PELVIS WITH CONTRAST
TECHNIQUE: Multidetector CT imaging of the abdomen and pelvis was performed
using the standard protocol following bolus administration of
intravenous contrast.

[Series 2: abd pelvis 5.00 · axial · 0.73mm/px · z∈[-1508,-1093]mm · 13 of 93 slices shown, 15 images]
[im 5/93  soft-tissue]
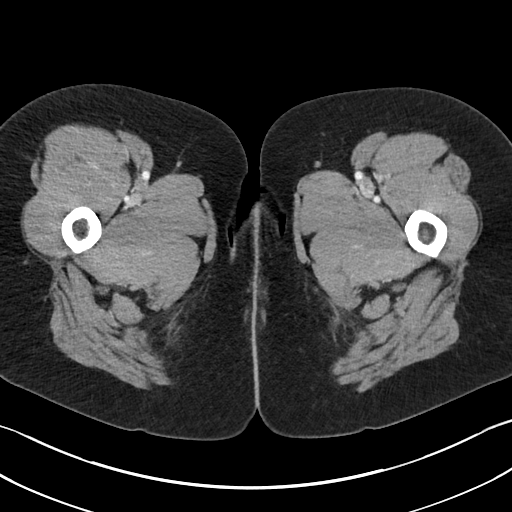
[im 5/93  bone]
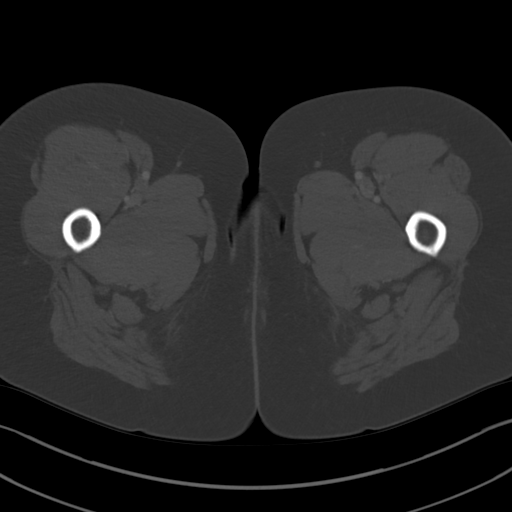
[im 15/93  soft-tissue]
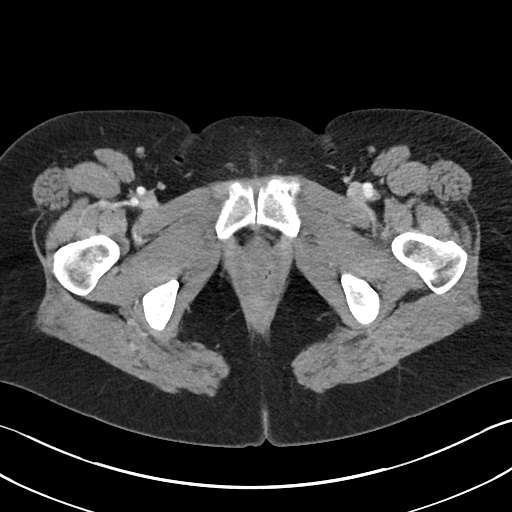
[im 20/93  soft-tissue]
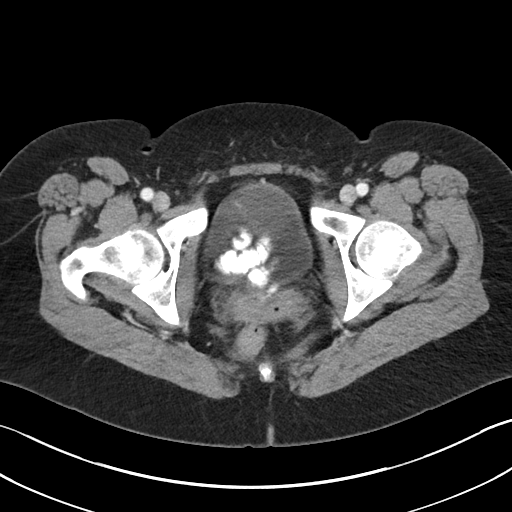
[im 25/93  soft-tissue]
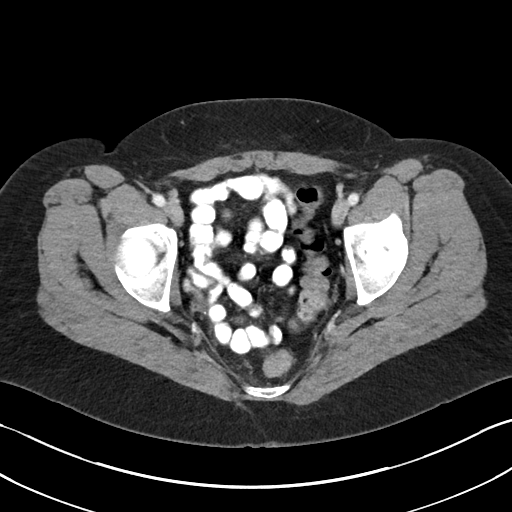
[im 34/93  soft-tissue]
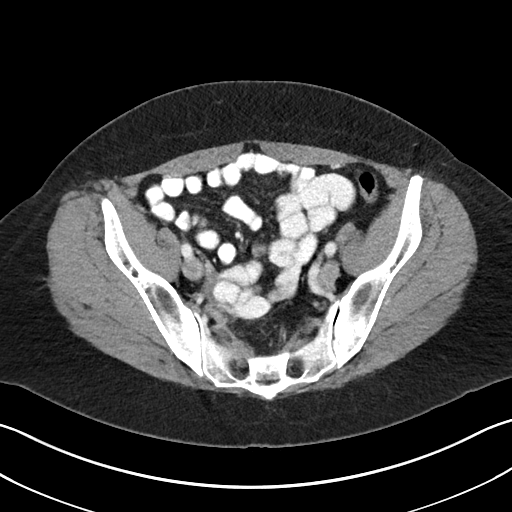
[im 39/93  soft-tissue]
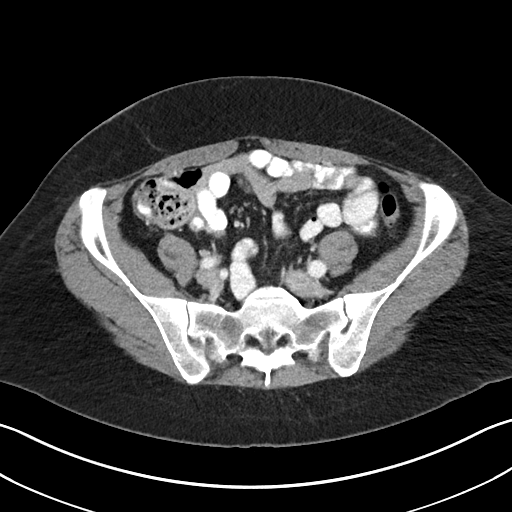
[im 49/93  soft-tissue]
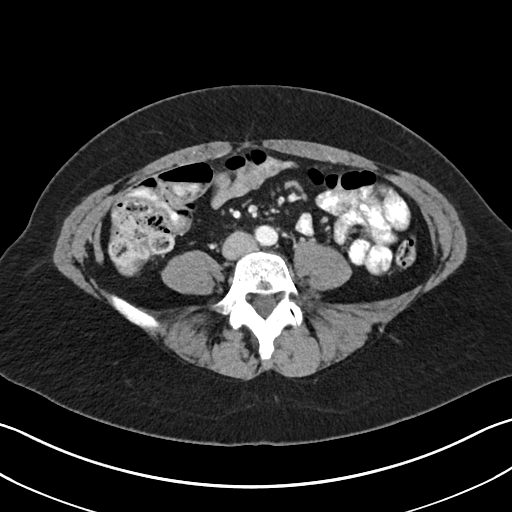
[im 54/93  soft-tissue]
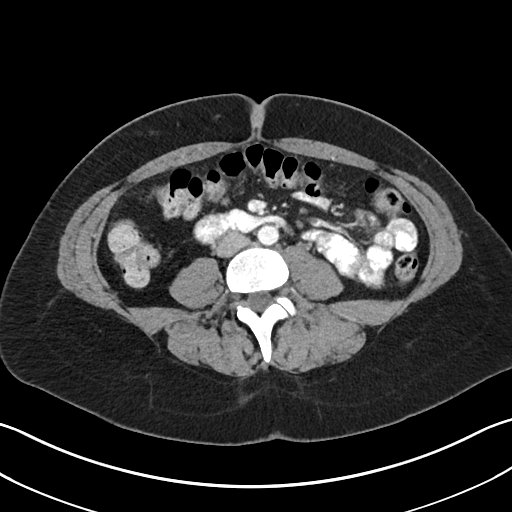
[im 59/93  soft-tissue]
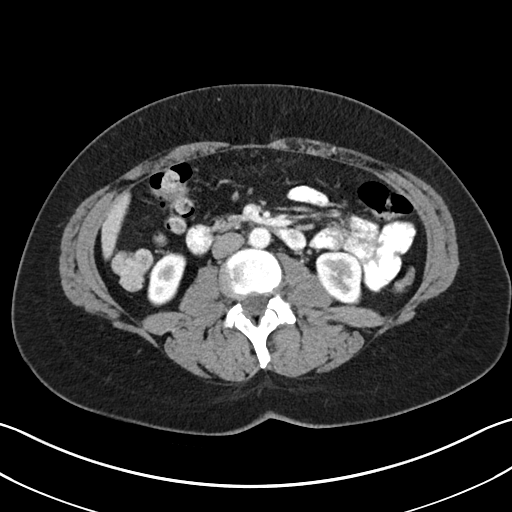
[im 59/93  bone]
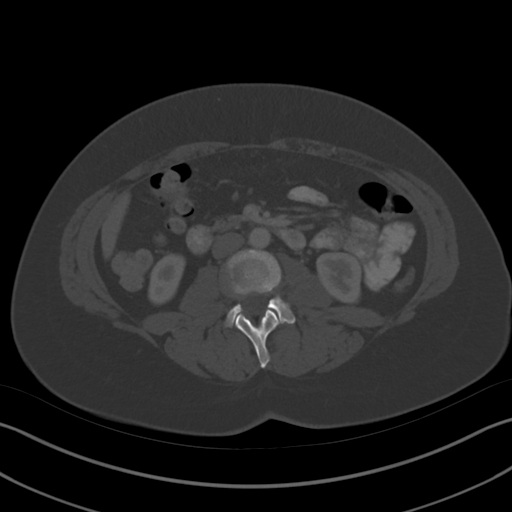
[im 68/93  soft-tissue]
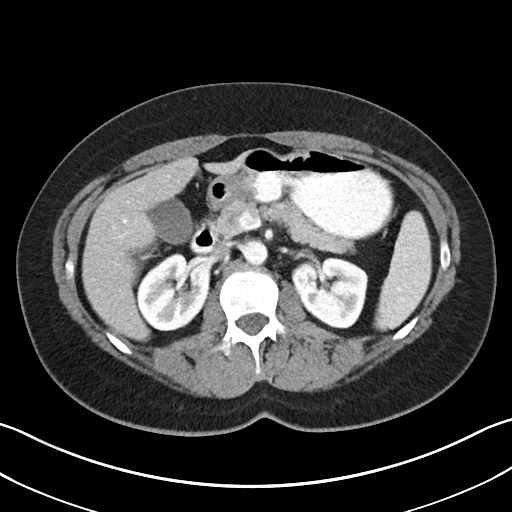
[im 73/93  soft-tissue]
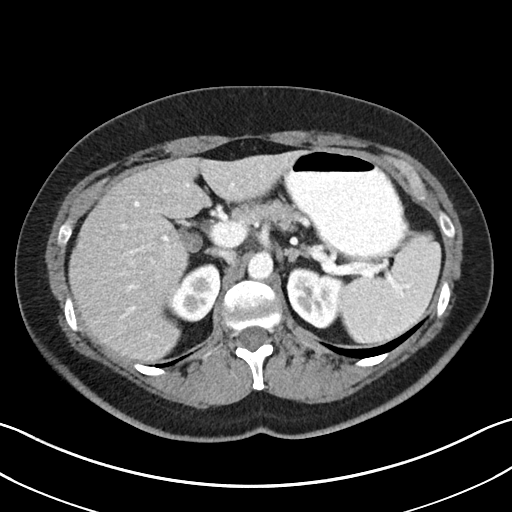
[im 78/93  soft-tissue]
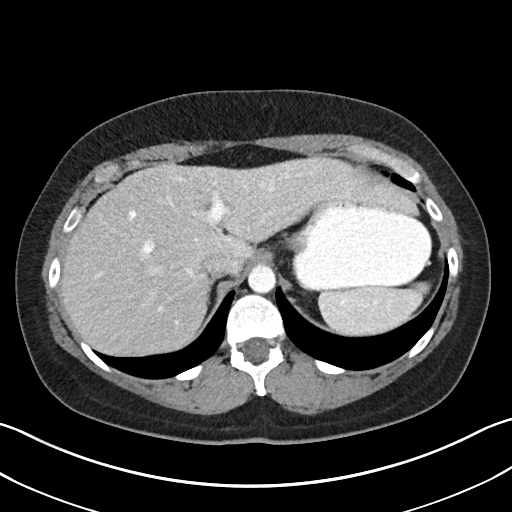
[im 88/93  soft-tissue]
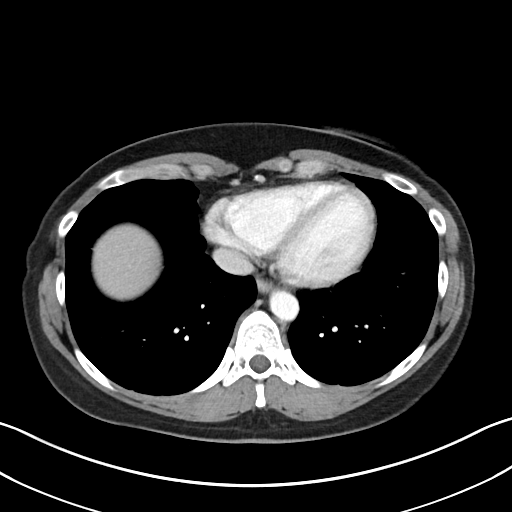

[Series 4: coronals abd pelvis 2.00 cor · coronal · 0.73mm/px · 3 of 138 slices shown]
[im 46/138  soft-tissue]
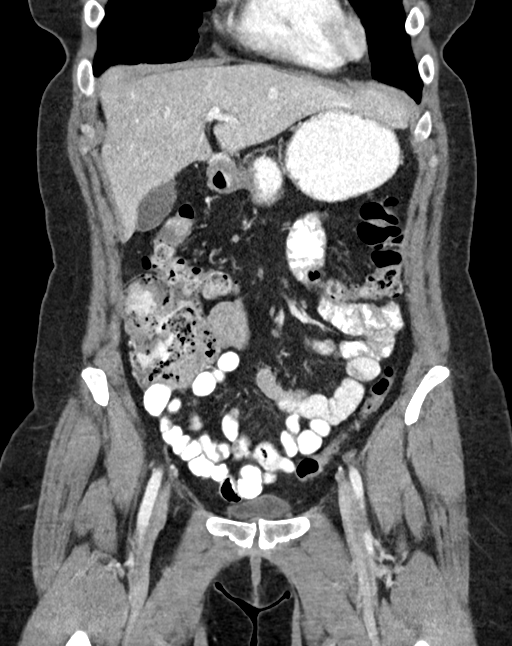
[im 61/138  soft-tissue]
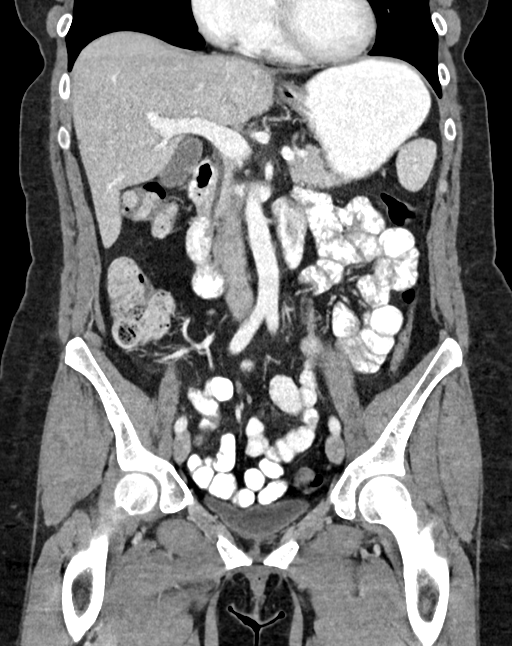
[im 77/138  soft-tissue]
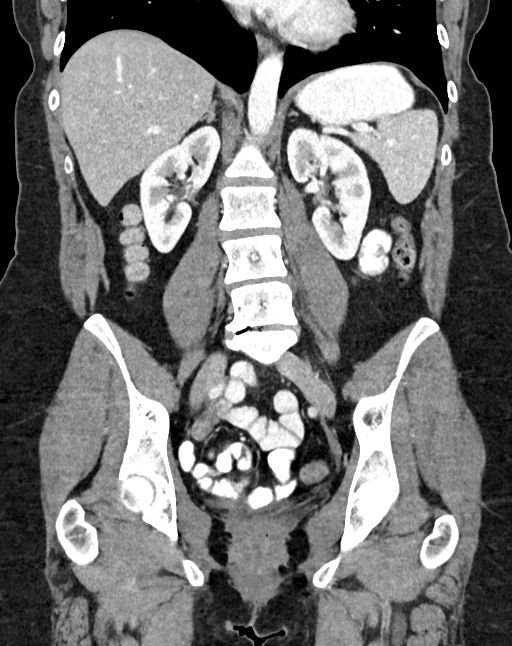

[16 of 46 positions shown; findings below may reference images not displayed]

RADIATION DOSE REDUCTION: This exam was performed according to the
departmental dose-optimization program which includes automated
exposure control, adjustment of the mA and/or kV according to
patient size and/or use of iterative reconstruction technique.

CONTRAST:  100mL OMNIPAQUE IOHEXOL 300 MG/ML  SOLN
FINDINGS: Lower chest: No acute abnormality.

Hepatobiliary: No focal liver abnormality is seen. No gallstones,
gallbladder wall thickening, or biliary dilatation.

Pancreas: Unremarkable. No pancreatic ductal dilatation or
surrounding inflammatory changes.

Spleen: Normal in size without focal abnormality.

Adrenals/Urinary Tract: Normal adrenal glands. No nephrolithiasis,
hydronephrosis or mass. Urinary bladder appears unremarkable.

Stomach/Bowel: Stomach is nondistended. Status post appendectomy.
There is no bowel wall thickening, inflammation, or distension.

Vascular/Lymphatic: No significant vascular findings are present. No
enlarged abdominal or pelvic lymph nodes.

Reproductive: Status post hysterectomy. No adnexal masses.

Other: No free fluid or fluid collections

Musculoskeletal: No acute or significant osseous findings.
IMPRESSION: 1. No acute findings within the abdomen or pelvis.
2. Status post appendectomy and hysterectomy.

## 2022-04-19 DIAGNOSIS — E6609 Other obesity due to excess calories: Secondary | ICD-10-CM

## 2022-04-19 MED ORDER — WEGOVY 1 MG/0.5ML ~~LOC~~ SOAJ: 1.0000 mg | SUBCUTANEOUS | 0 refills | Status: DC

## 2022-04-29 ENCOUNTER — Telehealth: Payer: BC Managed Care – PPO | Admitting: Family Medicine

## 2022-04-29 DIAGNOSIS — J069 Acute upper respiratory infection, unspecified: Secondary | ICD-10-CM | POA: Diagnosis not present

## 2022-04-29 MED ORDER — BENZONATATE 100 MG PO CAPS: 100.0000 mg | ORAL_CAPSULE | Freq: Three times a day (TID) | ORAL | 0 refills | Status: DC | PRN

## 2022-04-29 MED ORDER — AZELASTINE HCL 0.1 % NA SOLN: 2.0000 | Freq: Two times a day (BID) | NASAL | 12 refills | Status: DC

## 2022-04-29 NOTE — Progress Notes (Signed)
E-Visit for Upper Respiratory Infection   We are sorry you are not feeling well.  Here is how we plan to help!  Based on what you have shared with me, it looks like you may have a viral upper respiratory infection.  Upper respiratory infections are caused by a large number of viruses; however, rhinovirus is the most common cause.   Symptoms vary from person to person, with common symptoms including sore throat, cough, fatigue or lack of energy and feeling of general discomfort.  A low-grade fever of up to 100.4 may present, but is often uncommon.  Symptoms vary however, and are closely related to a person's age or underlying illnesses.  The most common symptoms associated with an upper respiratory infection are nasal discharge or congestion, cough, sneezing, headache and pressure in the ears and face.  These symptoms usually persist for about 3 to 10 days, but can last up to 2 weeks.  It is important to know that upper respiratory infections do not cause serious illness or complications in most cases.    Upper respiratory infections can be transmitted from person to person, with the most common method of transmission being a person's hands.  The virus is able to live on the skin and can infect other persons for up to 2 hours after direct contact.  Also, these can be transmitted when someone coughs or sneezes; thus, it is important to cover the mouth to reduce this risk.  To keep the spread of the illness at [place], good hand hygiene is very important.  This is an infection that is most likely caused by a virus. There are no specific treatments other than to help you with the symptoms until the infection runs its course.  We are sorry you are not feeling well.  Here is how we plan to help!   For nasal congestion, you may use an oral decongestants such as Mucinex D or if you have glaucoma or high blood pressure use plain Mucinex.  Saline nasal spray or nasal drops can help and can safely be used as often as  needed for congestion.  For your congestion, I have prescribed Azelastine nasal spray two sprays in each nostril twice a day If you are using Flonase- please use these together to help.  If you do not have a history of heart disease, hypertension, diabetes or thyroid disease, prostate/bladder issues or glaucoma, you may also use Sudafed to treat nasal congestion.  It is highly recommended that you consult with a pharmacist or your primary care physician to ensure this medication is safe for you to take.     If you have a cough, you may use cough suppressants such as Delsym and Robitussin.  If you have glaucoma or high blood pressure, you can also use Coricidin HBP.   For cough I have prescribed for you A prescription cough medication called Tessalon Perles 100 mg. You may take 1-2 capsules every 8 hours as needed for cough  If you have a sore or scratchy throat, use a saltwater gargle-  to  teaspoon of salt dissolved in a 4-ounce to 8-ounce glass of warm water.  Gargle the solution for approximately 15-30 seconds and then spit.  It is important not to swallow the solution.  You can also use throat lozenges/cough drops and Chloraseptic spray to help with throat pain or discomfort.  Warm or cold liquids can also be helpful in relieving throat pain.  For headache, pain or general discomfort, you can  use Ibuprofen or Tylenol as directed.   Some authorities believe that zinc sprays or the use of Echinacea may shorten the course of your symptoms.   HOME CARE Only take medications as instructed by your medical team. Be sure to drink plenty of fluids. Water is fine as well as fruit juices, sodas and electrolyte beverages. You may want to stay away from caffeine or alcohol. If you are nauseated, try taking small sips of liquids. How do you know if you are getting enough fluid? Your urine should be a pale yellow or almost colorless. Get rest. Taking a steamy shower or using a humidifier may help nasal  congestion and ease sore throat pain. You can place a towel over your head and breathe in the steam from hot water coming from a faucet. Using a saline nasal spray works much the same way. Cough drops, hard candies and sore throat lozenges may ease your cough. Avoid close contacts especially the very young and the elderly Cover your mouth if you cough or sneeze Always remember to wash your hands.   GET HELP RIGHT AWAY IF: You develop worsening fever. If your symptoms do not improve within 10 days You develop yellow or green discharge from your nose over 3 days. You have coughing fits You develop a severe head ache or visual changes. You develop shortness of breath, difficulty breathing or start having chest pain Your symptoms persist after you have completed your treatment plan  MAKE SURE YOU  Understand these instructions. Will watch your condition. Will get help right away if you are not doing well or get worse.  Thank you for choosing an e-visit.  Your e-visit answers were reviewed by a board certified advanced clinical practitioner to complete your personal care plan. Depending upon the condition, your plan could have included both over the counter or prescription medications.  Please review your pharmacy choice. Make sure the pharmacy is open so you can pick up prescription now. If there is a problem, you may contact your provider through [employer] and have the prescription routed to another pharmacy.  Your safety is important to [place]. If you have drug allergies check your prescription carefully.   For the next 24 hours you can use MyChart to ask questions about today's visit, request a non-urgent call back, or ask for a work or school excuse. You will get an email in the next two days asking about your experience. I hope that your e-visit has been valuable and will speed your recovery.    I provided 5 minutes of non face-to-face time during this encounter for chart review,  medication and order placement, as well as and documentation.

## 2022-05-10 ENCOUNTER — Ambulatory Visit: Payer: BC Managed Care – PPO | Admitting: Primary Care

## 2022-05-19 ENCOUNTER — Ambulatory Visit: Payer: BC Managed Care – PPO | Admitting: Primary Care

## 2022-05-19 ENCOUNTER — Encounter: Payer: Self-pay | Admitting: Primary Care

## 2022-05-19 VITALS — BP 102/76 | HR 72 | Temp 97.5°F | Ht 66.0 in | Wt 189.0 lb

## 2022-05-19 DIAGNOSIS — F32A Depression, unspecified: Secondary | ICD-10-CM | POA: Diagnosis not present

## 2022-05-19 DIAGNOSIS — F419 Anxiety disorder, unspecified: Secondary | ICD-10-CM | POA: Diagnosis not present

## 2022-05-19 DIAGNOSIS — J302 Other seasonal allergic rhinitis: Secondary | ICD-10-CM | POA: Diagnosis not present

## 2022-05-19 DIAGNOSIS — E669 Obesity, unspecified: Secondary | ICD-10-CM

## 2022-05-19 MED ORDER — MONTELUKAST SODIUM 10 MG PO TABS: 10.0000 mg | ORAL_TABLET | Freq: Every day | ORAL | 0 refills | Status: DC

## 2022-05-19 MED ORDER — SERTRALINE HCL 50 MG PO TABS: 75.0000 mg | ORAL_TABLET | Freq: Every day | ORAL | 0 refills | Status: DC

## 2022-05-19 NOTE — Progress Notes (Signed)
Subjective:    Patient ID: Meghan Jones, female    DOB: 12/27/1980, 42 y.o.   MRN: [id number]  Anxiety Symptoms include nervous/anxious behavior. Patient reports no chest pain, nausea or shortness of breath.      Meghan Jones is a very pleasant 42 y.o. female with a history of migraines, anxiety and depression, frequent headaches, fatigue, constipation, obesity who presents today for follow up of obesity and anxiety.  Currently managed on the [facility] 1 mg weekly dose for which she's been injecting for the last 1 month. Since her last visit she's lost 7 pounds.   Her insurance will no longer cover Wegovy, she has a 0.5 mg pen dose remaining at home.   She has increased intake of protein. She is more active outdoors when the weather is warmer. She is working to prep meals which has helped prevent making poor choices. She is motivated to to continue working on her weight loss once she finishes her regimen.   Currently managed on sertraline 50 mg. Over the last few months she's felt more anxiety, increased worrying, thinking worst case scenario, mind racing thoughts, difficulty sleeping. She feels like she needs a dose increase of her Zoloft. Has done well on Zoloft historically.  Wt Readings from Last 3 Encounters:  05/19/22 189 lb (85.7 kg)  01/25/22 196 lb (88.9 kg)  12/22/21 201 lb (91.2 kg)        Review of Systems  Respiratory:  Negative for shortness of breath.   Cardiovascular:  Negative for chest pain.  Gastrointestinal:  Negative for constipation and nausea.  Psychiatric/Behavioral:  The patient is nervous/anxious.          Past Medical History:  Diagnosis Date   Abdominal pain 09/01/2020   Abdominal pain, left upper quadrant 04/15/2021   Abdominal pain, right upper quadrant 04/15/2021   Acute otalgia, left 05/22/2020   Allergy    Constipation    Controlled with Shakeology through [facility]   Decreased renal function 04/03/2018   Depression    Mild   Elevated  lipase 04/22/2021   Nausea and vomiting 04/15/2021    Social History   Socioeconomic History   Marital status: Married    Spouse name: [name]   Number of children: 1   Years of education: 18   Highest education level: Master's degree (e.g., MA, MS, MEng, MEd, MSW, MBA)  Occupational History   Occupation: Industrial/product designer: [employer]    Comment: Engineer, 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    She attended [employer] in [place] ([employer]) and obtained her bachelors in Sport and exercise psychologist.    She then attended [employer] and obtained her Masters in [employer].    She lives at home with her husband [name]) and daughter [name]).    She enjoys working out Education officer, community.     Social Determinants of Health   Financial  Resource Strain: Low Risk  (05/15/2022)   Overall Financial Resource Strain (CARDIA)    Difficulty of Paying Living Expenses: Not hard at all  Food Insecurity: No Food Insecurity (05/15/2022)   Hunger Vital Sign    Worried About Running Out of Food in the Last Year: Never true    Ran Out of Food in the Last Year: Never true  Transportation Needs: No Transportation Needs (05/15/2022)   PRAPARE - Hydrologist (Medical): No    Lack of Transportation (Non-Medical): No  Physical Activity: Insufficiently Active (05/15/2022)   Exercise Vital Sign    Days of Exercise per Week: 2 days    Minutes of Exercise per Session: 40 min  Stress: Stress Concern  Present (05/15/2022)   [place]    Feeling of Stress : To some extent  Social Connections: Moderately Isolated (05/15/2022)   Social Connection and Isolation Panel [NHANES]    Frequency of Communication with Friends and Family: Three times a week    Frequency of Social Gatherings with Friends and Family: Once a week    Attends Religious Services: Never    Marine scientist or Organizations: No    Attends Music therapist: Not on file    Marital Status: Married  Human resources officer Violence: Not on file    Past Surgical History:  Procedure Laterality Date   ABDOMINAL HYSTERECTOMY  02/14/2009   Asherman's Syndrome   APPENDECTOMY  02/14/1998   BRAVO [place] STUDY N/A 01/25/2022   Procedure: BRAVO [place];  Surgeon: Lucilla Lame, MD;  Location: [facility] ENDOSCOPY;  Service: Endoscopy;  Laterality: N/A;   ESOPHAGOGASTRODUODENOSCOPY (EGD) WITH PROPOFOL N/A 01/25/2022   Procedure: ESOPHAGOGASTRODUODENOSCOPY (EGD) WITH PROPOFOL;  Surgeon: Lucilla Lame, MD;  Location[facility] ENDOSCOPY;  Service: Endoscopy;  Laterality: N/A;   HYSTEROSCOPY     KNEE ARTHROSCOPY W/ MENISCAL REPAIR Right     Family History  Problem Relation Age of Onset   Hyperlipidemia Maternal Grandfather    Hyperlipidemia Paternal Grandmother    Heart disease Paternal Grandmother 54       CAD - Died MI   Mental illness Paternal Grandmother    Hypertension Paternal Grandmother    Depression Paternal Grandmother        Suicide attempt x 3   Obesity Paternal Grandmother    Diabetes Paternal Grandmother    Hyperlipidemia Paternal Grandfather    Obesity Paternal Grandfather    Hypertension Paternal Grandfather    Diabetes Paternal Grandfather    Depression Sister    Breast cancer Neg Hx     No Known Allergies  Current Outpatient Medications on File Prior to Visit  Medication Sig Dispense Refill   Multiple Vitamin (MULTIVITAMIN) tablet Take 1  tablet by mouth daily. Reported on 06/12/2015     omeprazole (PRILOSEC) 40 MG capsule Take 1 capsule (40 mg total) by mouth at bedtime. For heartburn. 90 capsule 0   rizatriptan (MAXALT) 10 MG tablet Take 1 tablet by mouth at migraine onset. May repeat in 2 hours if needed 10 tablet 0   Semaglutide-Weight Management (WEGOVY) 1 MG/0.5ML SOAJ Inject 1 mg into the skin once a week. 6 mL 0   No current facility-administered medications on file prior to visit.    BP 102/76   Pulse 72   Temp (!) 97.5 F (36.4 C) (Temporal)   Ht 5\' 6"  (1.676 m)   Wt 189 lb (85.7 kg)   SpO2  100%   BMI 30.51 kg/m  Objective:   Physical Exam Cardiovascular:     Rate and Rhythm: Normal rate and regular rhythm.  Pulmonary:     Effort: Pulmonary effort is normal.     Breath sounds: Normal breath sounds.  Musculoskeletal:     Cervical back: Neck supple.  Skin:    General: Skin is warm and dry.           Assessment & Plan:  Obesity (BMI 30.0-34.9) Assessment & Plan: Commended her on weight loss. Encouraged regular exercise, continued meal prepping.  She will finish her last 0.5 mg Wegovy injection.    Seasonal allergic rhinitis, unspecified trigger -     Montelukast Sodium; Take 1 tablet (10 mg total) by mouth at bedtime. For allergies.  Dispense: 90 tablet; Refill: 0  Anxiety and depression Assessment & Plan: Deteriorated.  Increase Zoloft to 75 mg daily. New Rx sent to pharmacy.    Orders: -     Sertraline HCl; Take 1.5 tablets (75 mg total) by mouth daily. For anxiety  Dispense: 135 tablet; Refill: 0        Pleas Koch, NP

## 2022-05-19 NOTE — Assessment & Plan Note (Signed)
Deteriorated.  Increase Zoloft to 75 mg daily. New Rx sent to pharmacy.

## 2022-05-19 NOTE — Assessment & Plan Note (Signed)
Commended her on weight loss. Encouraged regular exercise, continued meal prepping.  She will finish her last 0.5 mg Wegovy injection.

## 2022-06-14 ENCOUNTER — Other Ambulatory Visit: Payer: Self-pay | Admitting: Primary Care

## 2022-06-14 DIAGNOSIS — G43909 Migraine, unspecified, not intractable, without status migrainosus: Secondary | ICD-10-CM

## 2022-06-14 DIAGNOSIS — K219 Gastro-esophageal reflux disease without esophagitis: Secondary | ICD-10-CM

## 2022-06-14 MED ORDER — OMEPRAZOLE 40 MG PO CPDR: 40.0000 mg | DELAYED_RELEASE_CAPSULE | Freq: Every day | ORAL | 0 refills | Status: DC

## 2022-06-14 MED ORDER — RIZATRIPTAN BENZOATE 10 MG PO TABS: ORAL_TABLET | ORAL | 0 refills | Status: DC

## 2022-06-14 NOTE — Telephone Encounter (Signed)
From: [name] To: Office of [name], NP Sent: 06/14/2022 10:43 AM EDT Subject: Medication Renewal Request  Refills have been requested for the following medications:   rizatriptan (MAXALT) 10 MG tablet [Ryett Hamman K Kage Willmann]  Preferred pharmacy: [facility] [place] - [address] Delivery method: [employer]

## 2022-06-14 NOTE — Telephone Encounter (Signed)
From: [name] To: Office of [name], NP Sent: 06/14/2022 9:02 AM EDT Subject: Medication Renewal Request  Refills have been requested for the following medications:   omeprazole (PRILOSEC) 40 MG capsule [Meghan Jones]  Preferred pharmacy: [facility] - [place] - [address] Delivery method: [employer]

## 2022-08-21 ENCOUNTER — Other Ambulatory Visit: Payer: Self-pay | Admitting: Primary Care

## 2022-08-21 DIAGNOSIS — J302 Other seasonal allergic rhinitis: Secondary | ICD-10-CM

## 2022-08-21 DIAGNOSIS — F32A Depression, unspecified: Secondary | ICD-10-CM

## 2022-09-26 DIAGNOSIS — N6310 Unspecified lump in the right breast, unspecified quadrant: Secondary | ICD-10-CM

## 2022-10-14 ENCOUNTER — Ambulatory Visit
Admission: RE | Admit: 2022-10-14 | Discharge: 2022-10-14 | Disposition: A | Discharge status: Home / Self Care | Payer: BC Managed Care – PPO | Source: Ambulatory Visit | Attending: Primary Care | Admitting: Primary Care

## 2022-10-14 DIAGNOSIS — N6310 Unspecified lump in the right breast, unspecified quadrant: Secondary | ICD-10-CM | POA: Diagnosis not present

## 2022-12-01 ENCOUNTER — Ambulatory Visit: Payer: BC Managed Care – PPO | Admitting: Primary Care

## 2022-12-06 ENCOUNTER — Ambulatory Visit: Payer: BC Managed Care – PPO | Admitting: Primary Care

## 2022-12-06 VITALS — BP 110/74 | HR 75 | Temp 97.5°F | Ht 66.0 in | Wt 176.0 lb

## 2022-12-06 DIAGNOSIS — G43709 Chronic migraine without aura, not intractable, without status migrainosus: Secondary | ICD-10-CM | POA: Diagnosis not present

## 2022-12-06 DIAGNOSIS — F419 Anxiety disorder, unspecified: Secondary | ICD-10-CM | POA: Diagnosis not present

## 2022-12-06 DIAGNOSIS — F32A Depression, unspecified: Secondary | ICD-10-CM

## 2022-12-06 MED ORDER — UBRELVY 50 MG PO TABS: ORAL_TABLET | ORAL | 0 refills | Status: DC

## 2022-12-06 MED ORDER — TOPIRAMATE 50 MG PO TABS: 50.0000 mg | ORAL_TABLET | Freq: Every day | ORAL | 1 refills | Status: DC

## 2022-12-06 NOTE — Patient Instructions (Addendum)
Stop Rizatriptan (Maxalt) 10 mg.  Start Topiramate (Topamax) 50 mg daily at bedtime for headache prevention.  Start Ubrogepant Bernita Raisin) 50 mg as needed for migraine abortion. Take 1 tablet by mouth at migraine onset. May repeat with 1 tablet 2 hours later if migraine persists.  Please reach out via MyChart as needed.  Schedule a physical for 6 months.   It was a pleasure to see you today!

## 2022-12-06 NOTE — Progress Notes (Signed)
Acute Office Visit  Subjective:     Patient ID: Meghan Jones, female    DOB: [birth year], 42 y.o.   MRN: [id number]  Chief Complaint  Patient presents with   Headache    Having headaches multiple times a week, some turn into migraines.     Headache  Pertinent negatives include no abdominal pain, blurred vision, dizziness, hearing loss, nausea, photophobia, tingling, vomiting or weakness.   Meghan Jones is a very pleasant 42 y.o. female with a history of migraines, anxiety and depression, frequent headaches who presents today to discuss increased headaches.   Over the last 2 months, she has experienced an increase in frequency of headaches and migraines. Previously, she used Maxalt 10 mg approximately once per month. Maxalt has not worked well for her migraines for about 1 year. Now for last 2 months she has been using Maxalt 2-3 times per week. She is having headaches with some turning into migraines 4-5 days out of the week. They are located in the left frontal region and she has photophobia with them. She has also tried Ibuprofen, Tylenol, and Advil PM. Weather changes are a known trigger for her.   She has had migraines since she was 42 years old. She has not been on a daily prevention medication for migraines since she was in college. She started a new job recently that is much less stressful, but she is in front of a computer more. She feels that this change correlates with her increasing headaches. Her eye doctor suggested reading glasses instead of blue light blocking ones and this has helped some. After her job change, she feels her anxiety is well controlled on Zoloft 50 mg daily.    Review of Systems  Constitutional:  Negative for malaise/fatigue.  HENT:  Negative for hearing loss.   Eyes:  Negative for blurred vision, double vision and photophobia.       Does have photophobia when experiencing migraines  Respiratory:  Negative for shortness of breath.   Cardiovascular:   Negative for chest pain.  Gastrointestinal:  Negative for abdominal pain, heartburn, nausea and vomiting.  Neurological:  Positive for headaches. Negative for dizziness, tingling, sensory change and weakness.  Psychiatric/Behavioral:  Negative for depression. The patient is not nervous/anxious.       Objective:    BP 110/74   Pulse 75   Temp (!) 97.5 F (36.4 C) (Temporal)   Ht 5\' 6"  (1.676 m)   Wt 176 lb (79.8 kg)   SpO2 99%   BMI 28.41 kg/m   BP Readings from Last 3 Encounters:  12/06/22 110/74  05/19/22 102/76  01/25/22 117/83   Wt Readings from Last 3 Encounters:  12/06/22 176 lb (79.8 kg)  05/19/22 189 lb (85.7 kg)  01/25/22 196 lb (88.9 kg)   Physical Exam Eyes:     General: No visual field deficit.    Extraocular Movements: Extraocular movements intact.     Pupils: Pupils are equal, round, and reactive to light.  Cardiovascular:     Rate and Rhythm: Normal rate and regular rhythm.     Heart sounds: Normal heart sounds.  Pulmonary:     Effort: Pulmonary effort is normal.  Neurological:     Mental Status: She is alert.     Cranial Nerves: No cranial nerve deficit.     Motor: No weakness.  Psychiatric:        Mood and Affect: Mood normal.        Behavior: Behavior normal.  No results found for any visits on 12/06/22.     Assessment & Plan:   Problem List Items Addressed This Visit       Cardiovascular and Mediastinum   Migraine - Primary    Uncontrolled.  Stop Rizatriptan (Maxalt) 10 mg as it is ineffective for her.   Start Topiramate (Topamax) 50 mg daily at bedtime for headache prevention.  Start Ubrogepant Bernita Raisin) 50 mg as needed for migraine abortion.   She will reach out via MyChart as needed.  Schedule a physical for 6 months.       Relevant Medications   topiramate (TOPAMAX) 50 MG tablet   Ubrogepant (UBRELVY) 50 MG TABS     Other   Anxiety and depression    Controlled.   Continue Zoloft 50 mg daily.  Schedule physical for  6 months.        Meds ordered this encounter  Medications   topiramate (TOPAMAX) 50 MG tablet    Sig: Take 1 tablet (50 mg total) by mouth at bedtime. For headache prevention.    Dispense:  90 tablet    Refill:  1   Ubrogepant (UBRELVY) 50 MG TABS    Sig: Take 1 tablet by mouth at migraine onset. May repeat with 1 tablet 2 hours later if migraine persists.    Dispense:  16 tablet    Refill:  0    No follow-ups on file.  Benito Mccreedy, RN

## 2022-12-06 NOTE — Assessment & Plan Note (Addendum)
Uncontrolled.  Stop Rizatriptan (Maxalt) 10 mg as it is ineffective.  Start Topiramate (Topamax) 50 mg daily at bedtime for headache prevention.  Start Ubrogepant Bernita Raisin) 50 mg as needed for migraine abortion.  Discussed instructions for use.  She will reach out via MyChart as needed.  Schedule a physical for 6 months.   I evaluated patient, was consulted regarding treatment, and agree with assessment and plan per Tenna Delaine, RN, DNP student.   Mayra Reel, NP-C

## 2022-12-06 NOTE — Progress Notes (Signed)
Subjective:    Patient ID: Meghan Jones, female    DOB: 14-Jun-1980, 42 y.o.   MRN: [id number]  Headache  Associated symptoms include nausea and photophobia.    Meghan Jones is a very pleasant 42 y.o. female with a history of migraines, seasonal allergic rhinitis, anxiety and depression who presents today to discuss migraines.   Chronic migraines since the age of 19. Currently managed on rizatriptan 10 mg PRN for migraines which has been effective historically.   Over the last 2 months she's noticed increased frequency of headaches and migraines which are now occurring 4-5 days weekly most every week. She's now having to use her second dose of rizatriptan 10 mg 2 hours after her initial pill. Maxalt has overall been ineffective for 1 year.   Headaches are mostly located to the left frontal lobe and behind her left eye with photophobia and nausea.   She has noticed a reduction of overall stress with a new job. Is working in front of a computer most of her day. Recently saw her eye doctor who recommended she wear readers. She feels well managed on Zoloft 50 mg daily, never increased to 75 mg.    Wt Readings from Last 3 Encounters:  12/06/22 176 lb (79.8 kg)  05/19/22 189 lb (85.7 kg)  01/25/22 196 lb (88.9 kg)      Review of Systems  Eyes:  Positive for photophobia.  Respiratory:  Negative for shortness of breath.   Cardiovascular:  Negative for chest pain.  Gastrointestinal:  Positive for nausea.  Neurological:  Positive for headaches.         Past Medical History:  Diagnosis Date   Abdominal pain 09/01/2020   Abdominal pain, left upper quadrant 04/15/2021   Abdominal pain, right upper quadrant 04/15/2021   Acute otalgia, left 05/22/2020   Allergy    Constipation    Controlled with Shakeology through [facility]   Decreased renal function 04/03/2018   Depression    Mild   Elevated lipase 04/22/2021   Nausea and vomiting 04/15/2021    Social History   Socioeconomic  History   Marital status: Married    Spouse name: [name]   Number of children: 1   Years of education: 18   Highest education level: Master's degree (e.g., MA, MS, MEng, MEd, MSW, MBA)  Occupational History   Occupation: Mining engineer: [employer]    Comment: [employer]  Tobacco Use   Smoking status: Never   Smokeless tobacco: Never  Vaping Use   Vaping status: Never Used  Substance and Sexual Activity   Alcohol use: Yes    Alcohol/week: 2.0 standard drinks of alcohol    Types: 1 Glasses of wine, 1 Shots of liquor per week    Comment: Occassionally 2 drinks weekly   Drug use: No   Sexual activity: Yes    Birth control/protection: Surgical  Other Topics Concern   Not on file  Social History Narrative   Penelopi grew up in [place], [place].    She attended [address] [place] in [place] ([employer]) and obtained her bachelors in Psychologist, sport and exercise.    She then attended [employer] and obtained her Masters in [employer].    She lives at home with her husband [name]) and daughter [name]).    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     Social Determinants of Health   Financial Resource Strain: Low Risk  (12/06/2022)   Overall  Financial Resource Strain (CARDIA)    Difficulty of Paying Living Expenses: Not hard at all  Food Insecurity: No Food Insecurity (12/06/2022)   Hunger Vital Sign    Worried About Running Out of Food in the Last Year: Never true    Ran Out of Food in the Last Year: Never true  Transportation Needs: No Transportation Needs (12/06/2022)   PRAPARE - Administrator, Civil Service (Medical): No    Lack of Transportation (Non-Medical): No  Physical Activity: Insufficiently Active (12/06/2022)   Exercise Vital Sign    Days of Exercise per Week: 2 days    Minutes of Exercise per Session: 30 min  Stress: Stress Concern Present (12/06/2022)   [employer] of Occupational Health - Occupational  Stress Questionnaire    Feeling of Stress : To some extent  Social Connections: Socially Integrated (12/06/2022)   Social Connection and Isolation Panel [NHANES]    Frequency of Communication with Friends and Family: Twice a week    Frequency of Social Gatherings with Friends and Family: Once a week    Attends Religious Services: 1 to 4 times per year    Active Member of Clubs or Organizations: Yes    Attends Engineer, structural: More than 4 times per year    Marital Status: Married  Catering manager Violence: Not on file    Past Surgical History:  Procedure Laterality Date   ABDOMINAL HYSTERECTOMY  02/14/2009   Asherman's Syndrome   APPENDECTOMY  02/14/1998   BRAVO PH STUDY N/A 01/25/2022   Procedure: BRAVO PH STUDY;  Surgeon: Midge Minium, MD;  Location: [facility] ENDOSCOPY;  Service: Endoscopy;  Laterality: N/A;   ESOPHAGOGASTRODUODENOSCOPY (EGD) WITH PROPOFOL N/A 01/25/2022   Procedure: ESOPHAGOGASTRODUODENOSCOPY (EGD) WITH PROPOFOL;  Surgeon: Midge Minium, MD;  Location: [facility] ENDOSCOPY;  Service: Endoscopy;  Laterality: N/A;   HYSTEROSCOPY     KNEE ARTHROSCOPY W/ MENISCAL REPAIR Right     Family History  Problem Relation Age of Onset   Hyperlipidemia Maternal Grandfather    Hyperlipidemia Paternal Grandmother    Heart disease Paternal Grandmother 61       CAD - Died MI   Mental illness Paternal Grandmother    Hypertension Paternal Grandmother    Depression Paternal Grandmother        Suicide attempt x 3   Obesity Paternal Grandmother    Diabetes Paternal Grandmother    Hyperlipidemia Paternal Grandfather    Obesity Paternal Grandfather    Hypertension Paternal Grandfather    Diabetes Paternal Grandfather    Depression Sister    Breast cancer Neg Hx     No Known Allergies  Current Outpatient Medications on File Prior to Visit  Medication Sig Dispense Refill   montelukast (SINGULAIR) 10 MG tablet TAKE 1 TABLET (10 MG TOTAL) BY MOUTH AT BEDTIME. FOR ALLERGIES.  90 tablet 0   Multiple Vitamin (MULTIVITAMIN) tablet Take 1 tablet by mouth daily. Reported on 06/12/2015     sertraline (ZOLOFT) 50 MG tablet TAKE 1.5 TABLETS (75 MG TOTAL) BY MOUTH DAILY. FOR ANXIETY 135 tablet 0   No current facility-administered medications on file prior to visit.    BP 110/74   Pulse 75   Temp (!) 97.5 F (36.4 C) (Temporal)   Ht 5\' 6"  (1.676 m)   Wt 176 lb (79.8 kg)   SpO2 99%   BMI 28.41 kg/m  Objective:   Physical Exam Cardiovascular:     Rate and Rhythm: Normal rate and regular rhythm.  Pulmonary:     Effort: Pulmonary effort is normal.     Breath sounds: Normal breath sounds.  Musculoskeletal:     Cervical back: Neck supple.  Skin:    General: Skin is warm and dry.  Neurological:     Mental Status: She is alert and oriented to person, place, and time.  Psychiatric:        Mood and Affect: Mood normal.           Assessment & Plan:  Chronic migraine without aura without status migrainosus, not intractable Assessment & Plan: Uncontrolled.  Stop Rizatriptan (Maxalt) 10 mg as it is ineffective.  Start Topiramate (Topamax) 50 mg daily at bedtime for headache prevention.  Start Ubrogepant Bernita Raisin) 50 mg as needed for migraine abortion.  Discussed instructions for use.  She will reach out via MyChart as needed.  Schedule a physical for 6 months.   I evaluated patient, was consulted regarding treatment, and agree with assessment and plan per Tenna Delaine, RN, DNP student.   Mayra Reel, NP-C   Orders: -     Topiramate; Take 1 tablet (50 mg total) by mouth at bedtime. For headache prevention.  Dispense: 90 tablet; Refill: 1 -     Ubrelvy; Take 1 tablet by mouth at migraine onset. May repeat with 1 tablet 2 hours later if migraine persists.  Dispense: 16 tablet; Refill: 0  Anxiety and depression Assessment & Plan: Controlled.   Continue Zoloft 50 mg daily.  Schedule physical for 6 months.          [name],  NP

## 2022-12-06 NOTE — Assessment & Plan Note (Signed)
Controlled.   Continue Zoloft 50 mg daily.  Schedule physical for 6 months.

## 2022-12-09 DIAGNOSIS — G43709 Chronic migraine without aura, not intractable, without status migrainosus: Secondary | ICD-10-CM

## 2022-12-09 MED ORDER — TOPIRAMATE 50 MG PO TABS: 50.0000 mg | ORAL_TABLET | Freq: Every day | ORAL | 0 refills | Status: DC

## 2022-12-09 NOTE — Telephone Encounter (Signed)
Can you send a short supply of topamax into [facility] until mail pharmacy sends her prescription?

## 2022-12-19 ENCOUNTER — Telehealth: Payer: Self-pay

## 2022-12-19 ENCOUNTER — Other Ambulatory Visit (HOSPITAL_COMMUNITY): Payer: Self-pay

## 2022-12-19 DIAGNOSIS — G43709 Chronic migraine without aura, not intractable, without status migrainosus: Secondary | ICD-10-CM

## 2022-12-19 NOTE — Telephone Encounter (Signed)
Pharmacy Patient Advocate Encounter   Received notification from CoverMyMeds that prior authorization for Ubrelvy 50MG  tablets is required/requested.   Insurance verification completed.   The patient is insured through [facility] .   Per test claim: PA required; PA submitted to above mentioned insurance via CoverMyMeds Key/confirmation #/EOC [id number] Status is pending

## 2022-12-20 ENCOUNTER — Other Ambulatory Visit (HOSPITAL_COMMUNITY): Payer: Self-pay

## 2022-12-20 NOTE — Telephone Encounter (Signed)
Noted  

## 2022-12-20 NOTE — Telephone Encounter (Signed)
Message sent to patient to make aware.

## 2022-12-20 NOTE — Telephone Encounter (Signed)
Pharmacy Patient Advocate Encounter  Received notification from [facility] that Prior Authorization for Ubrelvy 50MG  tablets has been APPROVED from 12/18/2022 to 12/18/2023. Ran test claim, Copay is $30.00. This test claim was processed through [facility]- copay amounts may vary at other pharmacies due to pharmacy/plan contracts, or as the patient moves through the different stages of their insurance plan.   PA #/Case ID/Reference #:  [id number]

## 2022-12-30 MED ORDER — UBRELVY 50 MG PO TABS: ORAL_TABLET | ORAL | 0 refills | Status: DC

## 2022-12-30 NOTE — Telephone Encounter (Signed)
Pharmacy called regarding this rx request, states they show that this was sent to [facility] pharmacy. I explained that I was confused because it did not show that on my end,. Pharmacy is requesting that this be re sent to [facility] mail order pharmacy.

## 2022-12-30 NOTE — Telephone Encounter (Signed)
The prescription was sent to [facility] on our end. I will resend the prescription now.

## 2022-12-30 NOTE — Addendum Note (Signed)
Addended by: [name] on: 12/30/2022 04:13 PM   Modules accepted: Orders

## 2023-01-02 MED ORDER — PROPRANOLOL HCL ER 80 MG PO CP24: 80.0000 mg | ORAL_CAPSULE | Freq: Every day | ORAL | 1 refills | Status: DC

## 2023-01-02 NOTE — Addendum Note (Signed)
Addended by: [name] on: 01/02/2023 07:57 PM   Modules accepted: Orders

## 2023-01-28 ENCOUNTER — Other Ambulatory Visit: Payer: Self-pay | Admitting: Primary Care

## 2023-01-28 DIAGNOSIS — F419 Anxiety disorder, unspecified: Secondary | ICD-10-CM

## 2023-01-28 DIAGNOSIS — J302 Other seasonal allergic rhinitis: Secondary | ICD-10-CM

## 2023-04-07 ENCOUNTER — Other Ambulatory Visit: Payer: Self-pay | Admitting: Primary Care

## 2023-04-07 DIAGNOSIS — G43709 Chronic migraine without aura, not intractable, without status migrainosus: Secondary | ICD-10-CM

## 2023-04-07 MED ORDER — UBRELVY 50 MG PO TABS: ORAL_TABLET | ORAL | 0 refills | Status: DC

## 2023-04-22 ENCOUNTER — Other Ambulatory Visit: Payer: Self-pay | Admitting: Primary Care

## 2023-04-22 DIAGNOSIS — G43709 Chronic migraine without aura, not intractable, without status migrainosus: Secondary | ICD-10-CM

## 2023-04-22 MED ORDER — UBRELVY 50 MG PO TABS: ORAL_TABLET | ORAL | 0 refills | Status: DC

## 2023-05-19 ENCOUNTER — Telehealth: Payer: Self-pay | Admitting: Physician Assistant

## 2023-05-19 DIAGNOSIS — L0390 Cellulitis, unspecified: Secondary | ICD-10-CM

## 2023-05-19 DIAGNOSIS — W57XXXA Bitten or stung by nonvenomous insect and other nonvenomous arthropods, initial encounter: Secondary | ICD-10-CM | POA: Diagnosis not present

## 2023-05-19 DIAGNOSIS — S80261A Insect bite (nonvenomous), right knee, initial encounter: Secondary | ICD-10-CM

## 2023-05-19 MED ORDER — CEPHALEXIN 500 MG PO CAPS: 500.0000 mg | ORAL_CAPSULE | Freq: Four times a day (QID) | ORAL | 0 refills | Status: DC

## 2023-05-19 MED ORDER — TRIAMCINOLONE ACETONIDE 0.1 % EX CREA: 1.0000 | TOPICAL_CREAM | Freq: Two times a day (BID) | CUTANEOUS | 0 refills | Status: DC

## 2023-05-19 MED ORDER — MUPIROCIN 2 % EX OINT: 1.0000 | TOPICAL_OINTMENT | Freq: Two times a day (BID) | CUTANEOUS | 0 refills | Status: DC

## 2023-05-19 NOTE — Progress Notes (Signed)
 E-Visit for Insect Sting  Thank you for describing the insect sting for [place].  Here is how we plan to help!  Based on the information you have shared with me I think you have: An uncomplicated insect sting that just occurred and can be closely followed using the instructions in your care plan.  The 2 greatest risks from insect stings are allergic reaction, which can be fatal in some people and infection, which is more common and less serious.  Bees, wasps, yellow jackets, and hornets belong to a class of insects called Hymenoptera.  Most insect stings cause only minor discomfort.  Stings can happen anywhere on the body and can be painful.  Most stings are from honey bees or yellow jackets.  Fire ants can sting multiple times.  The sites of the stings are more likely to become infected.    Based on your information I have: Prescribed Keflex 500mg  Take 1 capsule 4 times daily for 5 days. Prescribed Mupirocin ointment to apply topically twice daily for up to 7 days Prescribed Triamcinolone cream 0.1% apply topically twice daily as needed for inflammation and itching  What can be used to prevent Insect Stings?  Insect repellant with at least 20% DEET.  Wearing long pants and shirts with socks and shoes.  Wear dark or drab-colored clothes rather than bright colors.  Avoid using perfumes and hair sprays; these attract insects.  HOME CARE ADVICE:  1. Stinger removal: The stinger looks like a tiny black dot in the sting. Use a fingernail, credit card edge, or knife-edge to scrape it off.  Don't pull it out because it squeezes out more venom. If the stinger is below the skin surface, leave it alone.  It will be shed with normal skin healing. 2. Use cold compresses to the area of the sting for 10-20 minutes.  You may repeat this as needed to relieve symptoms of pain and swelling. 3.  For pain relief, take acetominophen 650 mg 4-6 hours as needed or ibuprofen 400 mg every 6-8 hours as needed or  naproxen 250-500 mg every 12 hours as needed. 4.  You can also use hydrocortisone cream 0.5% or 1% up to 4 times daily as needed for itching. 5.  If the sting becomes very itchy, take Benadryl 25-50 mg, follow directions on box. 6.  Wash the area 2-3 times daily with antibacterial soap and warm water. 7. Call your Doctor if: Fever, a severe headache, or rash occur in the next 2 weeks. Sting area begins to look infected. Redness and swelling worsens after home treatment. Your current symptoms become worse.    MAKE SURE YOU:  Understand these instructions. Will watch your condition. Will get help right away if you are not doing well or get worse.  Thank you for choosing an e-visit.  Your e-visit answers were reviewed by a board certified advanced clinical practitioner to complete your personal care plan. Depending upon the condition, your plan could have included both over the counter or prescription medications.  Please review your pharmacy choice. Make sure the pharmacy is open so you can pick up prescription now. If there is a problem, you may contact your provider through [employer] and have the prescription routed to another pharmacy.  Your safety is important to [place]. If you have drug allergies check your prescription carefully.   For the next 24 hours you can use MyChart to ask questions about today's visit, request a non-urgent call back, or ask for a work  or school excuse. You will get an email in the next two days asking about your experience. I hope that your e-visit has been valuable and will speed your recovery.    I have spent 5 minutes in review of e-visit questionnaire, review and updating patient chart, medical decision making and response to patient.   Margaretann Loveless, PA-C

## 2023-06-30 ENCOUNTER — Other Ambulatory Visit: Payer: Self-pay | Admitting: Primary Care

## 2023-06-30 DIAGNOSIS — J302 Other seasonal allergic rhinitis: Secondary | ICD-10-CM

## 2023-07-03 NOTE — Telephone Encounter (Signed)
Patient is due for CPE/follow up in August, this will be required prior to any further refills.  Please schedule, thank you!   

## 2023-07-04 NOTE — Telephone Encounter (Signed)
 Lvmtcb, sent mychart message

## 2023-07-05 NOTE — Telephone Encounter (Signed)
 Lvmtcb

## 2023-07-06 NOTE — Telephone Encounter (Signed)
 Called  pt and schedule a appt for cpe

## 2023-08-02 ENCOUNTER — Encounter: Admitting: Primary Care

## 2023-08-11 ENCOUNTER — Ambulatory Visit: Payer: Self-pay | Admitting: Primary Care

## 2023-08-11 ENCOUNTER — Ambulatory Visit (INDEPENDENT_AMBULATORY_CARE_PROVIDER_SITE_OTHER): Admitting: Primary Care

## 2023-08-11 VITALS — BP 122/70 | HR 70 | Temp 98.4°F | Ht 66.0 in | Wt 180.0 lb

## 2023-08-11 DIAGNOSIS — Z13220 Encounter for screening for lipoid disorders: Secondary | ICD-10-CM | POA: Diagnosis not present

## 2023-08-11 DIAGNOSIS — K219 Gastro-esophageal reflux disease without esophagitis: Secondary | ICD-10-CM

## 2023-08-11 DIAGNOSIS — Z0000 Encounter for general adult medical examination without abnormal findings: Secondary | ICD-10-CM | POA: Diagnosis not present

## 2023-08-11 DIAGNOSIS — G43009 Migraine without aura, not intractable, without status migrainosus: Secondary | ICD-10-CM | POA: Diagnosis not present

## 2023-08-11 DIAGNOSIS — F419 Anxiety disorder, unspecified: Secondary | ICD-10-CM

## 2023-08-11 DIAGNOSIS — F32A Depression, unspecified: Secondary | ICD-10-CM

## 2023-08-11 DIAGNOSIS — R519 Headache, unspecified: Secondary | ICD-10-CM

## 2023-08-11 DIAGNOSIS — J302 Other seasonal allergic rhinitis: Secondary | ICD-10-CM | POA: Diagnosis not present

## 2023-08-11 DIAGNOSIS — K5900 Constipation, unspecified: Secondary | ICD-10-CM

## 2023-08-11 DIAGNOSIS — R682 Dry mouth, unspecified: Secondary | ICD-10-CM

## 2023-08-11 DIAGNOSIS — Z1231 Encounter for screening mammogram for malignant neoplasm of breast: Secondary | ICD-10-CM

## 2023-08-11 LAB — COMPREHENSIVE METABOLIC PANEL WITH GFR
ALT: 17 U/L (ref 0–35)
AST: 17 U/L (ref 0–37)
Albumin: 4.3 g/dL (ref 3.5–5.2)
Alkaline Phosphatase: 53 U/L (ref 39–117)
BUN: 16 mg/dL (ref 6–23)
CO2: 30 meq/L (ref 19–32)
Calcium: 9.1 mg/dL (ref 8.4–10.5)
Chloride: 105 meq/L (ref 96–112)
Creatinine, Ser: 0.91 mg/dL (ref 0.40–1.20)
GFR: 77 mL/min (ref >60.00)
Glucose, Bld: 95 mg/dL (ref 70–99)
Potassium: 4.4 meq/L (ref 3.5–5.1)
Sodium: 139 meq/L (ref 135–145)
Total Bilirubin: 0.4 mg/dL (ref 0.2–1.2)
Total Protein: 6.3 g/dL (ref 6.0–8.3)

## 2023-08-11 LAB — HEMOGLOBIN A1C: Hgb A1c MFr Bld: 5.2 % (ref 4.6–6.5)

## 2023-08-11 LAB — LIPID PANEL
Cholesterol: 190 mg/dL (ref 0–200)
HDL: 67.4 mg/dL (ref >39.00)
LDL Cholesterol: 101 mg/dL — ABNORMAL HIGH (ref 0–99)
NonHDL: 122.95
Total CHOL/HDL Ratio: 3
Triglycerides: 112 mg/dL (ref 0.0–149.0)
VLDL: 22.4 mg/dL (ref 0.0–40.0)

## 2023-08-11 NOTE — Assessment & Plan Note (Signed)
 Controlled!  Continue Ubrelvy  50 mg PRN. Continue to monitor.

## 2023-08-11 NOTE — Assessment & Plan Note (Signed)
 Controlled.  Continue to monitor.

## 2023-08-11 NOTE — Patient Instructions (Signed)
 Stop by the lab prior to leaving today. I will notify you of your results once received.   It was a pleasure to see you today!

## 2023-08-11 NOTE — Assessment & Plan Note (Signed)
 Stable.   Continue Singulair 10 mg HS.

## 2023-08-11 NOTE — Assessment & Plan Note (Signed)
 Resolved

## 2023-08-11 NOTE — Progress Notes (Signed)
 Subjective:    Patient ID: Meghan Jones, female    DOB: Jun 17, 1980, 43 y.o.   MRN: [id number]  HPI  Meghan Jones is a very pleasant 43 y.o. female who presents today for complete physical and follow up of chronic conditions.  Immunizations: -Tetanus: Completed in 2021  Diet: Fair diet.  Exercise: No regular exercise.  Eye exam: Completes annually  Dental exam: Completes semi-annually    Pap Smear: Hysterectomy  Mammogram: Completed in 2024   Wt Readings from Last 3 Encounters:  08/11/23 180 lb (81.6 kg)  12/06/22 176 lb (79.8 kg)  05/19/22 189 lb (85.7 kg)      Review of Systems  Constitutional:  Negative for unexpected weight change.  HENT:  Negative for rhinorrhea.   Respiratory:  Negative for cough and shortness of breath.   Cardiovascular:  Negative for chest pain.  Gastrointestinal:  Negative for constipation and diarrhea.  Genitourinary:  Negative for difficulty urinating.  Musculoskeletal:  Negative for arthralgias and myalgias.  Skin:  Negative for rash.  Allergic/Immunologic: Negative for environmental allergies.  Neurological:  Negative for dizziness, numbness and headaches.  Psychiatric/Behavioral:  The patient is not nervous/anxious.          Past Medical History:  Diagnosis Date   Abdominal pain 09/01/2020   Abdominal pain, left upper quadrant 04/15/2021   Abdominal pain, right upper quadrant 04/15/2021   Acute otalgia, left 05/22/2020   Allergy    Constipation    Controlled with Shakeology through [facility]   Decreased renal function 04/03/2018   Depression    Mild   Elevated lipase 04/22/2021   Nausea and vomiting 04/15/2021    Social History   Socioeconomic History   Marital status: Married    Spouse name: Meghan Jones   Number of children: 1   Years of education: 18   Highest education level: Master's degree (e.g., MA, MS, MEng, MEd, MSW, MBA)  Occupational History   Occupation: Mining engineer: [employer]     Comment: [employer]  Tobacco Use   Smoking status: Never   Smokeless tobacco: Never  Vaping Use   Vaping status: Never Used  Substance and Sexual Activity   Alcohol use: Yes    Alcohol/week: 2.0 standard drinks of alcohol    Types: 1 Glasses of wine, 1 Shots of liquor per week    Comment: Occassionally 2 drinks weekly   Drug use: No   Sexual activity: Yes    Birth control/protection: Surgical  Other Topics Concern   Not on file  Social History Narrative   Meghan Jones grew up in [place], [place].    She attended [address] [employer] [place] in [place] ([employer]) and obtained her bachelors in Psychologist, sport and exercise.    She then attended [employer] and obtained her Masters in [employer].    She lives at home with her husband Meghan Jones) and daughter Meghan Jones).    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     Social Drivers of Corporate investment banker Strain: Low Risk  (08/11/2023)   Overall Financial Resource Strain (CARDIA)    Difficulty of Paying Living Expenses: Not hard at all  Food Insecurity: No Food Insecurity (08/11/2023)   Hunger Vital Sign    Worried About Running Out of Food in the Last Year: Never true    Ran Out of Food in the Last Year: Never true  Transportation Needs: No Transportation Needs (08/11/2023)   PRAPARE - Transportation  Lack of Transportation (Medical): No    Lack of Transportation (Non-Medical): No  Physical Activity: Insufficiently Active (08/11/2023)   Exercise Vital Sign    Days of Exercise per Week: 3 days    Minutes of Exercise per Session: 30 min  Stress: Stress Concern Present (08/11/2023)   [employer] of Occupational Health - Occupational Stress Questionnaire    Feeling of Stress: To some extent  Social Connections: Socially Integrated (08/11/2023)   Social Connection and Isolation Panel    Frequency of Communication with Friends and Family: More than three times a week    Frequency of Social Gatherings with Friends and Family:  Once a week    Attends Religious Services: 1 to 4 times per year    Active Member of Clubs or Organizations: Yes    Attends Banker Meetings: 1 to 4 times per year    Marital Status: Married  Catering manager Violence: Not on file    Past Surgical History:  Procedure Laterality Date   ABDOMINAL HYSTERECTOMY  02/14/2009   Asherman's Syndrome   APPENDECTOMY  02/14/1998   BRAVO PH STUDY N/A 01/25/2022   Procedure: BRAVO PH STUDY;  Surgeon: Jinny Carmine, MD;  Location: [facility] ENDOSCOPY;  Service: Endoscopy;  Laterality: N/A;   ESOPHAGOGASTRODUODENOSCOPY (EGD) WITH PROPOFOL  N/A 01/25/2022   Procedure: ESOPHAGOGASTRODUODENOSCOPY (EGD) WITH PROPOFOL ;  Surgeon: Jinny Carmine, MD;  Location: [facility] ENDOSCOPY;  Service: Endoscopy;  Laterality: N/A;   HYSTEROSCOPY     KNEE ARTHROSCOPY W/ MENISCAL REPAIR Right     Family History  Problem Relation Age of Onset   Hyperlipidemia Maternal Grandfather    Hyperlipidemia Paternal Grandmother    Heart disease Paternal Grandmother 85       CAD - Died MI   Mental illness Paternal Grandmother    Hypertension Paternal Grandmother    Depression Paternal Grandmother        Suicide attempt x 3   Obesity Paternal Grandmother    Diabetes Paternal Grandmother    Hyperlipidemia Paternal Grandfather    Obesity Paternal Grandfather    Hypertension Paternal Grandfather    Diabetes Paternal Grandfather    Depression Sister    Breast cancer Neg Hx     No Known Allergies  Current Outpatient Medications on File Prior to Visit  Medication Sig Dispense Refill   montelukast  (SINGULAIR ) 10 MG tablet TAKE 1 TABLET BY MOUTH AT BEDTIME FOR ALLERGIES 90 tablet 0   Multiple Vitamin (MULTIVITAMIN) tablet Take 1 tablet by mouth daily. Reported on 06/12/2015     sertraline  (ZOLOFT ) 50 MG tablet TAKE 1 & 1/2 TABLETS (75 MG TOTAL) BY MOUTH DAILY FOR ANXIETY 135 tablet 0   Ubrogepant  (UBRELVY ) 50 MG TABS Take 1 tablet by mouth at migraine onset. May repeat with 1  tablet 2 hours later if migraine persists. 6 tablet 0   No current facility-administered medications on file prior to visit.    BP 122/70   Pulse 70   Temp 98.4 F (36.9 C) (Temporal)   Ht 5' 6 (1.676 m)   Wt 180 lb (81.6 kg)   SpO2 98%   BMI 29.05 kg/m  Objective:   Physical Exam HENT:     Right Ear: Tympanic membrane and ear canal normal.     Left Ear: Tympanic membrane and ear canal normal.   Eyes:     Pupils: Pupils are equal, round, and reactive to light.    Cardiovascular:     Rate and Rhythm: Normal rate and regular rhythm.  Pulmonary:     Effort: Pulmonary effort is normal.     Breath sounds: Normal breath sounds.  Abdominal:     General: Bowel sounds are normal.     Palpations: Abdomen is soft.     Tenderness: There is no abdominal tenderness.   Musculoskeletal:        General: Normal range of motion.     Cervical back: Neck supple.   Skin:    General: Skin is warm and dry.   Neurological:     Mental Status: She is alert and oriented to person, place, and time.     Cranial Nerves: No cranial nerve deficit.     Deep Tendon Reflexes:     Reflex Scores:      Patellar reflexes are 2+ on the right side and 2+ on the left side.  Psychiatric:        Mood and Affect: Mood normal.           Assessment & Plan:  Preventative health care Assessment & Plan: Immunizations UTD. Mammogram due in August, orders placed.  Discussed the importance of a healthy diet and regular exercise in order for weight loss, and to reduce the risk of further co-morbidity.  Exam stable. Labs pending.  Follow up in 1 year for repeat physical.   Orders: -     Lipid panel -     Hemoglobin A1c -     Comprehensive metabolic panel with GFR  Migraine without aura and without status migrainosus, not intractable Assessment & Plan: Controlled!  Continue Ubrelvy  50 mg PRN. Continue to monitor.    Seasonal allergic rhinitis, unspecified trigger Assessment &  Plan: Stable.  Continue Singulair  10 mg HS   Gastroesophageal reflux disease, unspecified whether esophagitis present Assessment & Plan: Controlled.  Continue to monitor.    Anxiety and depression Assessment & Plan: Controlled.  Continue Zoloft  50 mg daily.   Dry mouth Assessment & Plan: Improved with less sugar intake. Continue to monitor.   Constipation, unspecified constipation type Assessment & Plan: Resolved.   Frequent headaches Assessment & Plan: Significant improvement!  Continue Ubrelvy  50 mg as needed.   Screening mammogram for breast cancer -     3D Screening Mammogram, Left and Right; Future  Screening for lipid disorders -     Lipid panel -     Hemoglobin A1c -     Comprehensive metabolic panel with GFR        Comer MARLA Gaskins, NP

## 2023-08-11 NOTE — Assessment & Plan Note (Signed)
 Improved with less sugar intake. Continue to monitor.

## 2023-08-11 NOTE — Assessment & Plan Note (Signed)
 Controlled.  Continue Zoloft 50 mg daily.

## 2023-08-11 NOTE — Assessment & Plan Note (Signed)
 Significant improvement!  Continue Ubrelvy  50 mg as needed.

## 2023-08-11 NOTE — Assessment & Plan Note (Signed)
 Immunizations UTD. Mammogram due in August, orders placed.  Discussed the importance of a healthy diet and regular exercise in order for weight loss, and to reduce the risk of further co-morbidity.  Exam stable. Labs pending.  Follow up in 1 year for repeat physical.

## 2023-10-10 ENCOUNTER — Other Ambulatory Visit: Payer: Self-pay

## 2023-10-10 DIAGNOSIS — J302 Other seasonal allergic rhinitis: Secondary | ICD-10-CM

## 2023-10-10 MED ORDER — MONTELUKAST SODIUM 10 MG PO TABS: 10.0000 mg | ORAL_TABLET | Freq: Every day | ORAL | 2 refills | Status: AC

## 2023-10-16 ENCOUNTER — Other Ambulatory Visit: Payer: Self-pay | Admitting: Primary Care

## 2023-10-16 DIAGNOSIS — G43709 Chronic migraine without aura, not intractable, without status migrainosus: Secondary | ICD-10-CM

## 2023-10-20 ENCOUNTER — Encounter

## 2023-11-03 ENCOUNTER — Encounter

## 2023-11-10 ENCOUNTER — Ambulatory Visit
Admission: RE | Admit: 2023-11-10 | Discharge: 2023-11-10 | Disposition: A | Discharge status: Home / Self Care | Source: Ambulatory Visit | Attending: Primary Care | Admitting: Primary Care

## 2023-11-10 DIAGNOSIS — Z1231 Encounter for screening mammogram for malignant neoplasm of breast: Secondary | ICD-10-CM | POA: Insufficient documentation

## 2023-12-21 ENCOUNTER — Other Ambulatory Visit (HOSPITAL_COMMUNITY): Payer: Self-pay

## 2023-12-22 ENCOUNTER — Other Ambulatory Visit (HOSPITAL_COMMUNITY): Payer: Self-pay

## 2023-12-22 ENCOUNTER — Telehealth: Payer: Self-pay

## 2023-12-22 NOTE — Telephone Encounter (Signed)
 Pharmacy Patient Advocate Encounter  Received notification from [facility] that Prior Authorization for Ubrelvy  50 has been APPROVED from 12/22/23 to 12/21/24. Ran test claim, Copay is $0.00. This test claim was processed through [facility]- copay amounts may vary at other pharmacies due to pharmacy/plan contracts, or as the patient moves through the different stages of their insurance plan.   PA #/Case ID/Reference #: # [id number]

## 2023-12-22 NOTE — Telephone Encounter (Signed)
 Pharmacy Patient Advocate Encounter   Received notification from Onbase that prior authorization for Ubrelvy  50 is required/requested.   Insurance verification completed.   The patient is insured through [facility].   Per test claim: PA required; PA submitted to above mentioned insurance via Latent Key/confirmation #/EOC [id number] Status is pending

## 2024-01-17 ENCOUNTER — Ambulatory Visit: Admitting: Primary Care

## 2024-01-17 VITALS — BP 122/78 | HR 86 | Temp 97.9°F | Ht 66.0 in | Wt 186.4 lb

## 2024-01-17 DIAGNOSIS — R079 Chest pain, unspecified: Secondary | ICD-10-CM

## 2024-01-17 DIAGNOSIS — K219 Gastro-esophageal reflux disease without esophagitis: Secondary | ICD-10-CM

## 2024-01-17 MED ORDER — PANTOPRAZOLE SODIUM 20 MG PO TBEC: 20.0000 mg | DELAYED_RELEASE_TABLET | Freq: Every day | ORAL | 0 refills | Status: AC

## 2024-01-17 NOTE — Assessment & Plan Note (Signed)
 Symptoms representative of esophageal reflux, especially given history.  Stop omeprazole  40 mg as this seems ineffective. Start pantoprazole  20 mg twice daily.  We discussed the appropriate administration for PPIs.  She will update in 1 to 2 weeks  Consider right upper quadrant abdominal ultrasound for evaluation of gallbladder if no improvement

## 2024-01-17 NOTE — Patient Instructions (Signed)
 Stop taking omeprazole  for heartburn.  Start taking pantoprazole  20 mg twice daily with meals.  Please update me in a few weeks as discussed.  It was a pleasure to see you today!

## 2024-01-17 NOTE — Progress Notes (Signed)
 Subjective:    Patient ID: Meghan Jones, female    DOB: 1980-05-13, 43 y.o.   MRN: [id number]  KENECIA BARREN is a very pleasant 43 y.o. female with a history of constipation, hiatal hernia, chest pain who presents today to discuss chest pain.  Her pain is located to the left upper chest which began a few months ago. She describes her pain as stabbing and sharp, intermittent, takes my breath away. Her pain occurs during the night, wakes her from sleep, occurs at rest. Also with increased pain within a few minutes of eating and when bending forward. More recent her symptoms are occurring more consistently. Also with generalized lower abdominal bloating, belching. She's been sleeping with her head up, cannot lay flat or even roll over in bed due to her night.   History of the same chest pain in 2023, presented to the ED thinking that the symptoms are secondary to a heart attack.  Cardiac wise she was normal.  Evaluated by GI previously and was prescribed omeprazole  40 mg.  At the time this helped to resolve her symptoms.  She resumed omeprazole  40 mg about 1 week ago without improvement.    She underwent upper endoscopy in 2023 which was grossly unremarkable except for small hiatal hernia.  She denies changes in diet, new supplements. She continues to exercise.   She denies constipation, diarrhea, nausea, vomiting, right upper quadrant abdominal pain, left upper quadrant abdominal pain.  She does experience epigastric discomfort at times.   Review of Systems  Cardiovascular:  Positive for chest pain.  Gastrointestinal:  Negative for constipation, diarrhea, nausea and vomiting.       Belching, abdominal bloating         Past Medical History:  Diagnosis Date   Abdominal pain 09/01/2020   Abdominal pain, left upper quadrant 04/15/2021   Abdominal pain, right upper quadrant 04/15/2021   Acute otalgia, left 05/22/2020   Allergy    Constipation    Controlled with Shakeology through [facility]  Body   Decreased renal function 04/03/2018   Depression    Mild   Elevated lipase 04/22/2021   Nausea and vomiting 04/15/2021    Social History   Socioeconomic History   Marital status: Married    Spouse name: Karleen   Number of children: 1   Years of education: 18   Highest education level: Master's degree (e.g., MA, MS, MEng, MEd, MSW, MBA)  Occupational History   Occupation: Mining Engineer: [employer]    Comment: [employer]  Tobacco Use   Smoking status: Never   Smokeless tobacco: Never  Vaping Use   Vaping status: Never Used  Substance and Sexual Activity   Alcohol use: Yes    Alcohol/week: 2.0 standard drinks of alcohol    Types: 1 Glasses of wine, 1 Shots of liquor per week    Comment: Occassionally 2 drinks weekly   Drug use: No   Sexual activity: Yes    Birth control/protection: Surgical  Other Topics Concern   Not on file  Social History Narrative   Mazey grew up in [place], [place].    She attended [address] [employer] [place] in [place] ([employer]) and obtained her bachelors in Psychologist, Sport And Exercise.    She then attended [employer] and obtained her Masters in [employer].    She lives at home with her husband Radonna) and daughter Debora).    She enjoys working out civil service fast streamer.  Social Drivers of Corporate Investment Banker Strain: Low Risk  (01/17/2024)   Overall Financial Resource Strain (CARDIA)    Difficulty of Paying Living Expenses: Not hard at all  Food Insecurity: No Food Insecurity (01/17/2024)   Hunger Vital Sign    Worried About Running Out of Food in the Last Year: Never true    Ran Out of Food in the Last Year: Never true  Transportation Needs: No Transportation Needs (01/17/2024)   PRAPARE - Administrator, Civil Service (Medical): No    Lack of Transportation (Non-Medical): No  Physical Activity: Insufficiently Active (01/17/2024)   Exercise Vital Sign    Days of Exercise per Week: 3  days    Minutes of Exercise per Session: 30 min  Stress: No Stress Concern Present (01/17/2024)   [employer] of Occupational Health - Occupational Stress Questionnaire    Feeling of Stress: Only a little  Social Connections: Moderately Integrated (01/17/2024)   Social Connection and Isolation Panel    Frequency of Communication with Friends and Family: Three times a week    Frequency of Social Gatherings with Friends and Family: Once a week    Attends Religious Services: 1 to 4 times per year    Active Member of [employer] or Organizations: No    Attends Engineer, Structural: Not on file    Marital Status: Married  Catering Manager Violence: Not on file    Past Surgical History:  Procedure Laterality Date   ABDOMINAL HYSTERECTOMY  02/14/2009   Asherman's Syndrome   APPENDECTOMY  02/14/1998   BRAVO PH STUDY N/A 01/25/2022   Procedure: BRAVO PH STUDY;  Surgeon: Jinny Carmine, MD;  Location: [facility] ENDOSCOPY;  Service: Endoscopy;  Laterality: N/A;   ESOPHAGOGASTRODUODENOSCOPY (EGD) WITH PROPOFOL  N/A 01/25/2022   Procedure: ESOPHAGOGASTRODUODENOSCOPY (EGD) WITH PROPOFOL ;  Surgeon: Jinny Carmine, MD;  Location: [facility] ENDOSCOPY;  Service: Endoscopy;  Laterality: N/A;   HYSTEROSCOPY     KNEE ARTHROSCOPY W/ MENISCAL REPAIR Right     Family History  Problem Relation Age of Onset   Hyperlipidemia Maternal Grandfather    Hyperlipidemia Paternal Grandmother    Heart disease Paternal Grandmother 67       CAD - Died MI   Mental illness Paternal Grandmother    Hypertension Paternal Grandmother    Depression Paternal Grandmother        Suicide attempt x 3   Obesity Paternal Grandmother    Diabetes Paternal Grandmother    Hyperlipidemia Paternal Grandfather    Obesity Paternal Grandfather    Hypertension Paternal Grandfather    Diabetes Paternal Grandfather    Depression Sister    Breast cancer Neg Hx     No Known Allergies  Current Outpatient Medications on File Prior to Visit   Medication Sig Dispense Refill   montelukast  (SINGULAIR ) 10 MG tablet Take 1 tablet (10 mg total) by mouth at bedtime. 90 tablet 2   Multiple Vitamin (MULTIVITAMIN) tablet Take 1 tablet by mouth daily. Reported on 06/12/2015     Ubrogepant  (UBRELVY ) 50 MG TABS TAKE 1 TABLET AT MIGRAINE  ONSET. MAY REPEAT WITH 1   TABLET 2 HOURS LATER IF    MIGRAINE PERSISTS 6 tablet 0   No current facility-administered medications on file prior to visit.    BP 122/78   Pulse 86   Temp 97.9 F (36.6 C) (Oral)   Ht 5' 6 (1.676 m)   Wt 186 lb 6 oz (84.5 kg)   SpO2 96%  BMI 30.08 kg/m  Objective:   Physical Exam Cardiovascular:     Rate and Rhythm: Normal rate and regular rhythm.  Pulmonary:     Effort: Pulmonary effort is normal.     Breath sounds: Normal breath sounds.  Abdominal:     General: Bowel sounds are normal.     Palpations: Abdomen is soft.     Tenderness: There is no abdominal tenderness.  Musculoskeletal:     Cervical back: Neck supple.  Skin:    General: Skin is warm and dry.  Neurological:     Mental Status: She is alert and oriented to person, place, and time.  Psychiatric:        Mood and Affect: Mood normal.     Physical Exam        Assessment & Plan:  Gastroesophageal reflux disease, unspecified whether esophagitis present Assessment & Plan: Symptoms representative of esophageal reflux, especially given history.  Stop omeprazole  40 mg as this seems ineffective. Start pantoprazole  20 mg twice daily.  We discussed the appropriate administration for PPIs.  She will update in 1 to 2 weeks  Consider right upper quadrant abdominal ultrasound for evaluation of gallbladder if no improvement  Reviewed GI notes and upper endoscopy from 2023.  Orders: -     Pantoprazole  Sodium; Take 1 tablet (20 mg total) by mouth daily. for heartburn.  Dispense: 180 tablet; Refill: 0  Chest pain at rest Assessment & Plan: Symptoms representative of esophageal reflux, especially  given history.  Stop omeprazole  40 mg as this seems ineffective. Start pantoprazole  20 mg twice daily.  We discussed the appropriate administration for PPIs.  She will update in 1 to 2 weeks  Consider right upper quadrant abdominal ultrasound for evaluation of gallbladder if no improvement     Assessment and Plan Assessment & Plan         Comer MARLA Gaskins, NP

## 2024-01-17 NOTE — Assessment & Plan Note (Addendum)
 Symptoms representative of esophageal reflux, especially given history.  Stop omeprazole  40 mg as this seems ineffective. Start pantoprazole  20 mg twice daily.  We discussed the appropriate administration for PPIs.  She will update in 1 to 2 weeks  Consider right upper quadrant abdominal ultrasound for evaluation of gallbladder if no improvement  Reviewed GI notes and upper endoscopy from 2023.

## 2024-01-31 MED ORDER — TOPIRAMATE 50 MG PO TABS: 50.0000 mg | ORAL_TABLET | Freq: Every day | ORAL | 0 refills | Status: AC

## 2024-02-01 MED ORDER — UBRELVY 50 MG PO TABS: ORAL_TABLET | ORAL | 0 refills | Status: AC
